# Patient Record
Sex: Male | Born: 1980 | Race: Black or African American | Hispanic: No | Marital: Married | Smoking: Current some day smoker
Health system: Southern US, Community
[De-identification: ages and names within clinical notes are randomized; demographics above are authoritative.]

## PROBLEM LIST (undated history)

## (undated) DIAGNOSIS — G4730 Sleep apnea, unspecified: Secondary | ICD-10-CM

## (undated) HISTORY — PX: VASECTOMY: SHX75

---

## 2013-06-27 ENCOUNTER — Emergency Department: Payer: Self-pay | Admitting: Emergency Medicine

## 2013-07-19 ENCOUNTER — Emergency Department: Payer: Self-pay | Admitting: Emergency Medicine

## 2013-07-28 ENCOUNTER — Emergency Department: Payer: Self-pay | Admitting: Emergency Medicine

## 2013-07-30 LAB — BETA STREP CULTURE(ARMC)

## 2013-08-30 ENCOUNTER — Emergency Department: Payer: Self-pay | Admitting: Emergency Medicine

## 2014-10-16 ENCOUNTER — Emergency Department
Admit: 2014-10-16 | Disposition: A | Discharge status: Home / Self Care | Payer: Self-pay | Admitting: Emergency Medicine

## 2014-10-16 LAB — BASIC METABOLIC PANEL
ANION GAP: 7 (ref 7–16)
BUN: 15 mg/dL
CO2: 30 mmol/L
Calcium, Total: 9.4 mg/dL
Chloride: 102 mmol/L
Creatinine: 1.13 mg/dL
EGFR (African American): >60
EGFR (Non-African Amer.): >60
Glucose: 105 mg/dL — ABNORMAL HIGH
Potassium: 3.7 mmol/L
SODIUM: 139 mmol/L

## 2014-10-16 LAB — URINALYSIS, COMPLETE
Bacteria: NONE SEEN
Bilirubin,UR: NEGATIVE
Blood: NEGATIVE
Glucose,UR: NEGATIVE mg/dL (ref 0–75)
Ketone: NEGATIVE
Leukocyte Esterase: NEGATIVE
NITRITE: NEGATIVE
PROTEIN: NEGATIVE
Ph: 7 (ref 4.5–8.0)
SPECIFIC GRAVITY: 1.015 (ref 1.003–1.030)
Squamous Epithelial: NONE SEEN

## 2014-10-16 LAB — CBC WITH DIFFERENTIAL/PLATELET
BASOS PCT: 0.9 %
Basophil #: 0.1 10*3/uL (ref 0.0–0.1)
EOS ABS: 0 10*3/uL (ref 0.0–0.7)
Eosinophil %: 0.5 %
HCT: 43.7 % (ref 40.0–52.0)
HGB: 14.3 g/dL (ref 13.0–18.0)
LYMPHS PCT: 28.9 %
Lymphocyte #: 2 10*3/uL (ref 1.0–3.6)
MCH: 27.7 pg (ref 26.0–34.0)
MCHC: 32.7 g/dL (ref 32.0–36.0)
MCV: 85 fL (ref 80–100)
Monocyte #: 0.7 x10 3/mm (ref 0.2–1.0)
Monocyte %: 10.3 %
NEUTROS ABS: 4.2 10*3/uL (ref 1.4–6.5)
Neutrophil %: 59.4 %
Platelet: 200 10*3/uL (ref 150–440)
RBC: 5.16 10*6/uL (ref 4.40–5.90)
RDW: 13.7 % (ref 11.5–14.5)
WBC: 7 10*3/uL (ref 3.8–10.6)

## 2014-10-16 LAB — TROPONIN I: Troponin-I: <0.03 ng/mL

## 2014-10-18 LAB — BETA STREP CULTURE(ARMC)

## 2014-10-20 LAB — GC/CHLAMYDIA PROBE AMP

## 2015-02-21 DIAGNOSIS — M545 Low back pain: Secondary | ICD-10-CM | POA: Diagnosis present

## 2015-02-21 DIAGNOSIS — E860 Dehydration: Secondary | ICD-10-CM | POA: Diagnosis not present

## 2015-02-21 DIAGNOSIS — K5900 Constipation, unspecified: Secondary | ICD-10-CM | POA: Insufficient documentation

## 2015-02-21 NOTE — ED Notes (Signed)
Pt in with co low back pain since today, has had urinary frequency.  Also co constipation, last BM Saturday.

## 2015-02-22 ENCOUNTER — Emergency Department
Admission: EM | Admit: 2015-02-22 | Discharge: 2015-02-22 | Disposition: A | Discharge status: Home / Self Care | Payer: 59 | Attending: Emergency Medicine | Admitting: Emergency Medicine

## 2015-02-22 DIAGNOSIS — E860 Dehydration: Secondary | ICD-10-CM

## 2015-02-22 DIAGNOSIS — K5900 Constipation, unspecified: Secondary | ICD-10-CM

## 2015-02-22 LAB — URINALYSIS COMPLETE WITH MICROSCOPIC (ARMC ONLY)
Bacteria, UA: NONE SEEN
Bilirubin Urine: NEGATIVE
Glucose, UA: NEGATIVE mg/dL
HGB URINE DIPSTICK: NEGATIVE
Leukocytes, UA: NEGATIVE
Nitrite: NEGATIVE
Protein, ur: NEGATIVE mg/dL
Specific Gravity, Urine: 1.029 (ref 1.005–1.030)
Squamous Epithelial / LPF: NONE SEEN
pH: 7 (ref 5.0–8.0)

## 2015-02-22 LAB — CBC
HCT: 44.1 % (ref 40.0–52.0)
HEMOGLOBIN: 14.5 g/dL (ref 13.0–18.0)
MCH: 27.8 pg (ref 26.0–34.0)
MCHC: 32.9 g/dL (ref 32.0–36.0)
MCV: 84.5 fL (ref 80.0–100.0)
Platelets: 198 10*3/uL (ref 150–440)
RBC: 5.22 MIL/uL (ref 4.40–5.90)
RDW: 13.4 % (ref 11.5–14.5)
WBC: 9.5 10*3/uL (ref 3.8–10.6)

## 2015-02-22 LAB — BASIC METABOLIC PANEL
ANION GAP: 8 (ref 5–15)
BUN: 17 mg/dL (ref 6–20)
CALCIUM: 9.4 mg/dL (ref 8.9–10.3)
CO2: 29 mmol/L (ref 22–32)
CREATININE: 1.64 mg/dL — AB (ref 0.61–1.24)
Chloride: 97 mmol/L — ABNORMAL LOW (ref 101–111)
GFR calc Af Amer: >60 mL/min (ref >60)
GFR calc non Af Amer: 54 mL/min — ABNORMAL LOW (ref >60)
GLUCOSE: 100 mg/dL — AB (ref 65–99)
Potassium: 4.2 mmol/L (ref 3.5–5.1)
Sodium: 134 mmol/L — ABNORMAL LOW (ref 135–145)

## 2015-02-22 MED ORDER — POLYETHYLENE GLYCOL 3350 17 GM/SCOOP PO POWD: 17.0000 g | Freq: Once | ORAL | Status: AC

## 2015-02-22 NOTE — ED Notes (Signed)
Pt reports vasectomy procedure 3 weeks ago.

## 2015-02-22 NOTE — Discharge Instructions (Signed)
Constipation Constipation is when a person:  Poops (has a bowel movement) less than 3 times a week.  Has a hard time pooping.  Has poop that is dry, hard, or bigger than normal. HOME CARE   Eat foods with a lot of fiber in them. This includes fruits, vegetables, beans, and whole grains such as brown rice.  Avoid fatty foods and foods with a lot of sugar. This includes french fries, hamburgers, cookies, candy, and soda.  If you are not getting enough fiber from food, take products with added fiber in them (supplements).  Drink enough fluid to keep your pee (urine) clear or pale yellow.  Exercise on a regular basis, or as told by your doctor.  Go to the restroom when you feel like you need to poop. Do not hold it.  Only take medicine as told by your doctor. Do not take medicines that help you poop (laxatives) without talking to your doctor first. GET HELP RIGHT AWAY IF:   You have bright red blood in your poop (stool).  Your constipation lasts more than 4 days or gets worse.  You have belly (abdominal) or butt (rectal) pain.  You have thin poop (as thin as a pencil).  You lose weight, and it cannot be explained. MAKE SURE YOU:   Understand these instructions.  Will watch your condition.  Will get help right away if you are not doing well or get worse. Document Released: 12/10/2007 Document Revised: 06/28/2013 Document Reviewed: 04/04/2013 [facility] Patient Information 2015 [place], [place]. This information is not intended to replace advice given to you by your health care provider. Make sure you discuss any questions you have with your health care provider.  Dehydration, Adult Dehydration is when you lose more fluids from the body than you take in. Vital organs like the kidneys, brain, and heart cannot function without a proper amount of fluids and salt. Any loss of fluids from the body can cause dehydration.  CAUSES   Vomiting.  Diarrhea.  Excessive  sweating.  Excessive urine output.  Fever. SYMPTOMS  Mild dehydration  Thirst.  Dry lips.  Slightly dry mouth. Moderate dehydration  Very dry mouth.  Sunken eyes.  Skin does not bounce back quickly when lightly pinched and released.  Dark urine and decreased urine production.  Decreased tear production.  Headache. Severe dehydration  Very dry mouth.  Extreme thirst.  Rapid, weak pulse (more than 100 beats per minute at rest).  Cold hands and feet.  Not able to sweat in spite of heat and temperature.  Rapid breathing.  Blue lips.  Confusion and lethargy.  Difficulty being awakened.  Minimal urine production.  No tears. DIAGNOSIS  Your caregiver will diagnose dehydration based on your symptoms and your exam. Blood and urine tests will help confirm the diagnosis. The diagnostic evaluation should also identify the cause of dehydration. TREATMENT  Treatment of mild or moderate dehydration can often be done at home by increasing the amount of fluids that you drink. It is best to drink small amounts of fluid more often. Drinking too much at one time can make vomiting worse. Refer to the home care instructions below. Severe dehydration needs to be treated at the hospital where you will probably be given intravenous (IV) fluids that contain water and electrolytes. HOME CARE INSTRUCTIONS   Ask your caregiver about specific rehydration instructions.  Drink enough fluids to keep your urine clear or pale yellow.  Drink small amounts frequently if you have nausea and vomiting.  Eat as you normally do.  Avoid:  Foods or drinks high in sugar.  Carbonated drinks.  Juice.  Extremely hot or cold fluids.  Drinks with caffeine.  Fatty, greasy foods.  Alcohol.  Tobacco.  Overeating.  Gelatin desserts.  Wash your hands well to avoid spreading bacteria and viruses.  Only take over-the-counter or prescription medicines for pain, discomfort, or fever as  directed by your caregiver.  Ask your caregiver if you should continue all prescribed and over-the-counter medicines.  Keep all follow-up appointments with your caregiver. SEEK MEDICAL CARE IF:  You have abdominal pain and it increases or stays in one area (localizes).  You have a rash, stiff neck, or severe headache.  You are irritable, sleepy, or difficult to awaken.  You are weak, dizzy, or extremely thirsty. SEEK IMMEDIATE MEDICAL CARE IF:   You are unable to keep fluids down or you get worse despite treatment.  You have frequent episodes of vomiting or diarrhea.  You have blood or green matter (bile) in your vomit.  You have blood in your stool or your stool looks black and tarry.  You have not urinated in 6 to 8 hours, or you have only urinated a small amount of very dark urine.  You have a fever.  You faint. MAKE SURE YOU:   Understand these instructions.  Will watch your condition.  Will get help right away if you are not doing well or get worse. Document Released: 06/23/2005 Document Revised: 09/15/2011 Document Reviewed: 02/10/2011 [facility] Patient Information 2015 [place], [place]. This information is not intended to replace advice given to you by your health care provider. Make sure you discuss any questions you have with your health care provider.

## 2015-02-22 NOTE — ED Provider Notes (Signed)
[facility] Emergency Department Provider Note  Time seen: 2:00 AM  I have reviewed the triage vital signs and the nursing notes.   HISTORY  Chief Complaint Back Pain    HPI Michael Hampton is a 34 y.o. malepresents the emergency department with lower back pain, urinary frequency, and constipation. According to the patient he had a vasectomy 3 weeks ago, since then he is at some trouble with his bowels. He has not had a bowel movement for the past 4 days, however while waiting in the emergency department the patient had a small bowel movement. States darker urine along with urinary frequency but denies any dysuria, penile discharge, groin pain. Denies any abdominal pain, nausea, vomiting, fever. Denies bloody stool.     No past medical history on file.  There are no active problems to display for this patient.   No past surgical history on file.  No current outpatient prescriptions on file.  Allergies Review of patient's allergies indicates no known allergies.  No family history on file.  Social History Social History  Substance Use Topics  . Smoking status: Not on file  . Smokeless tobacco: Not on file  . Alcohol Use: Not on file    Review of Systems Constitutional: Negative for fever. Cardiovascular: Negative for chest pain. Respiratory: Negative for shortness of breath. Gastrointestinal: Negative for abdominal pain. Positive for constipation. Genitourinary: Negative for dysuria. Musculoskeletal: Positive for lower back pain. Neurological: Negative for headache 10-point ROS otherwise negative.  ____________________________________________   PHYSICAL EXAM:  VITAL SIGNS: ED Triage Vitals  Enc Vitals Group     BP 02/21/15 2328 147/97 mmHg     Pulse Rate 02/21/15 2328 106     Resp 02/21/15 2328 18     Temp 02/21/15 2328 98.6 F (37 C)     Temp Source 02/21/15 2328 Oral     SpO2 02/21/15 2328 96 %     Weight 02/21/15 2328 265 lb  (120.203 kg)     Height 02/21/15 2328  (1.676 m)     Head Cir --      Peak Flow --      Pain Score 02/21/15 2328 3     Pain Loc --      Pain Edu? --      Excl. in GC? --     Constitutional: Alert and oriented. Well appearing and in no distress. Eyes: Normal exam ENT   Mouth/Throat: Mucous membranes are moist. Cardiovascular: Normal rate, regular rhythm. No murmur Respiratory: Normal respiratory effort without tachypnea nor retractions. Breath sounds are clear and equal bilaterally. No wheezes/rales/rhonchi. Gastrointestinal: Soft and nontender. No distention.  There is no CVA tenderness. Musculoskeletal: No back tenderness to palpation. Neurologic:  Normal speech and language. No gross focal neurologic deficits Skin:  Skin is warm, dry and intact.  Psychiatric: Mood and affect are normal. Speech and behavior are normal. Patient exhibits appropriate insight and judgment.  ____________________________________________     INITIAL IMPRESSION / ASSESSMENT AND PLAN / ED COURSE  Pertinent labs & imaging results that were available during my care of the patient were reviewed by me and considered in my medical decision making (see chart for details).  We will check labs, urinalysis. The patient admits he has not been drinking much fluid over the past few days. Has not taken anything over-the-counter for his constipation. Overall the patient appears well, nontender abdomen, no CVA tenderness, no back tenderness to palpation.   Overall labs are consistent mild  dehydration. I discussed with the patient the need to increase his oral intake of fluids. Patient agreeable. We'll also place the patient on MiraLAX for the next several days. Patient agreeable as well. Discussed strict abdominal pain return precautions to which she is agreeable. ____________________________________________   FINAL CLINICAL IMPRESSION(S) / ED DIAGNOSES  Constipation Lower back pain   Minna Antis,  MD 02/22/15 [phone]

## 2015-05-30 ENCOUNTER — Emergency Department
Admission: EM | Admit: 2015-05-30 | Discharge: 2015-05-31 | Disposition: A | Discharge status: Home / Self Care | Payer: 59 | Attending: Emergency Medicine | Admitting: Emergency Medicine

## 2015-05-30 ENCOUNTER — Encounter: Payer: Self-pay | Admitting: Emergency Medicine

## 2015-05-30 DIAGNOSIS — N5089 Other specified disorders of the male genital organs: Secondary | ICD-10-CM | POA: Insufficient documentation

## 2015-05-30 DIAGNOSIS — Z9852 Vasectomy status: Secondary | ICD-10-CM | POA: Insufficient documentation

## 2015-05-30 DIAGNOSIS — N50812 Left testicular pain: Secondary | ICD-10-CM | POA: Insufficient documentation

## 2015-05-30 DIAGNOSIS — Z79899 Other long term (current) drug therapy: Secondary | ICD-10-CM | POA: Diagnosis not present

## 2015-05-30 DIAGNOSIS — R358 Other polyuria: Secondary | ICD-10-CM | POA: Diagnosis not present

## 2015-05-30 DIAGNOSIS — R350 Frequency of micturition: Secondary | ICD-10-CM | POA: Diagnosis not present

## 2015-05-30 DIAGNOSIS — N5082 Scrotal pain: Secondary | ICD-10-CM

## 2015-05-30 DIAGNOSIS — R110 Nausea: Secondary | ICD-10-CM | POA: Diagnosis not present

## 2015-05-30 NOTE — ED Notes (Signed)
Patient presents to the ED with left testicular pain and swelling.  Patient states pain started in the wee hours of the morning this morning.  Patient had a vasectomy in September.  Denies any previous swelling and pain after recovery from the surgery.

## 2015-05-30 NOTE — ED Notes (Addendum)
Pt presents to ED with left testicle/groin pain/swelling since yesterday with frequent urination. Pt denies discharge. Pt reports vasectomy done about 2 months ago with no issues.

## 2015-05-30 NOTE — ED Provider Notes (Signed)
[facility] Emergency Department Provider Note  ____________________________________________  Time seen: Approximately 11:21 PM  I have reviewed the triage vital signs and the nursing notes.   HISTORY  Chief Complaint Groin Pain and Groin Swelling    HPI Michael Hampton is a 34 y.o. male who presents to the ED from home with a chief complaint of left testicular pain and swelling. Patient states he awoke yesterday approximately 5 AM, noted pain and swelling to his left testicle associated with frequent urination. Patient denies trauma, heavy lifting or injury. Reports vasectomy done approximately 2 months ago without complications. Patient also notes polydipsia in addition to polyuria. Denies associated fever, chills, chest pain, shortness of breath, abdominal pain, vomiting, diarrhea. States he has occasional nausea with waves of pain. Nothing makes his pain better or worse.   Past medical history None   There are no active problems to display for this patient.   Past Surgical History  Procedure Laterality Date  . Vasectomy      Current Outpatient Rx  Name  Route  Sig  Dispense  Refill  . polyethylene glycol powder (GLYCOLAX/MIRALAX) powder   Oral   Take 17 g by mouth once.   119 g   0     Allergies Review of patient's allergies indicates no known allergies.  History reviewed. No pertinent family history.  Social History Social History  Substance Use Topics  . Smoking status: Current Some Day Smoker    Types: Cigars  . Smokeless tobacco: Never Used  . Alcohol Use: None    Review of Systems Constitutional: No fever/chills Eyes: No visual changes. ENT: No sore throat. Cardiovascular: Denies chest pain. Respiratory: Denies shortness of breath. Gastrointestinal: No abdominal pain.  No nausea, no vomiting.  No diarrhea.  No constipation. Genitourinary: Positive for left testicular pain and swelling. Negative for dysuria. Musculoskeletal:  Negative for back pain. Skin: Negative for rash. Neurological: Negative for headaches, focal weakness or numbness.  10-point ROS otherwise negative.  ____________________________________________   PHYSICAL EXAM:  VITAL SIGNS: ED Triage Vitals  Enc Vitals Group     BP 05/30/15 2244 139/92 mmHg     Pulse Rate 05/30/15 2244 80     Resp 05/30/15 2244 16     Temp 05/30/15 2244 98.3 F (36.8 C)     Temp Source 05/30/15 2244 Oral     SpO2 05/30/15 2244 94 %     Weight --      Height --      Head Cir --      Peak Flow --      Pain Score 05/30/15 2245 2     Pain Loc --      Pain Edu? --      Excl. in GC? --     Constitutional: Alert and oriented. Well appearing and in no acute distress. Eyes: Conjunctivae are normal. PERRL. EOMI. Head: Atraumatic. Nose: No congestion/rhinnorhea. Mouth/Throat: Mucous membranes are moist.  Oropharynx non-erythematous. Neck: No stridor.   Cardiovascular: Normal rate, regular rhythm. Grossly normal heart sounds.  Good peripheral circulation. Respiratory: Normal respiratory effort.  No retractions. Lungs CTAB. Gastrointestinal: Soft and nontender. No distention. No abdominal bruits. No CVA tenderness. Genitourinary: Circumcised male. No palpable inguinal masses or hernias. No discharge from the head of the penis. Left testicle does not appear to be particularly swollen. Mild tenderness on palpation to posterior left testicle. No firm or horizontally lying masses noted. Musculoskeletal: No lower extremity tenderness nor edema.  No joint  effusions. Neurologic:  Normal speech and language. No gross focal neurologic deficits are appreciated. No gait instability. Skin:  Skin is warm, dry and intact. No rash noted. Psychiatric: Mood and affect are normal. Speech and behavior are normal.  ____________________________________________   LABS (all labs ordered are listed, but only abnormal results are displayed)  Labs Reviewed  URINALYSIS COMPLETEWITH  MICROSCOPIC ([facility] ONLY) - Abnormal; Notable for the following:    Color, Urine YELLOW (*)    APPearance CLEAR (*)    All other components within normal limits  BASIC METABOLIC PANEL - Abnormal; Notable for the following:    Glucose, Bld 132 (*)    Anion gap 4 (*)    All other components within normal limits  CBC WITH DIFFERENTIAL/PLATELET   ____________________________________________  EKG  None ____________________________________________  RADIOLOGY  Ultrasound scrotum interpreted per Dr. Cherly Hensen: Unremarkable scrotal ultrasound. No evidence for testicular torsion. ____________________________________________   PROCEDURES  Procedure(s) performed: None  Critical Care performed: No  ____________________________________________   INITIAL IMPRESSION / ASSESSMENT AND PLAN / ED COURSE  Pertinent labs & imaging results that were available during my care of the patient were reviewed by me and considered in my medical decision making (see chart for details).  34 year old male who presents with nontraumatic left testicular pain and swelling times one day associated with frequent urination. Given patient's complaints of polydipsia as well and family history of diabetes, will obtain screening lab work in addition to urinalysis and testicular ultrasound.  ----------------------------------------- 1:25 AM on 05/31/2015 -----------------------------------------  Updated patient of laboratory, urinalysis and imaging results. Will place on empiric Cipro 5 days for clinical presentation of epididymitis. Strict return precautions given. Patient verbalizes understanding and agrees with plan of care. ____________________________________________   FINAL CLINICAL IMPRESSION(S) / ED DIAGNOSES  Final diagnoses:  Testicular pain, left      Irean Hong, MD 05/31/15 [phone]

## 2015-05-31 ENCOUNTER — Emergency Department: Payer: 59

## 2015-05-31 LAB — CBC WITH DIFFERENTIAL/PLATELET
BASOS PCT: 1 %
Basophils Absolute: 0.1 10*3/uL (ref 0–0.1)
EOS ABS: 0.1 10*3/uL (ref 0–0.7)
EOS PCT: 1 %
HEMATOCRIT: 43.6 % (ref 40.0–52.0)
Hemoglobin: 14.4 g/dL (ref 13.0–18.0)
Lymphocytes Relative: 34 %
Lymphs Abs: 2.8 10*3/uL (ref 1.0–3.6)
MCH: 27.9 pg (ref 26.0–34.0)
MCHC: 32.9 g/dL (ref 32.0–36.0)
MCV: 84.7 fL (ref 80.0–100.0)
MONOS PCT: 9 %
Monocytes Absolute: 0.7 10*3/uL (ref 0.2–1.0)
Neutro Abs: 4.5 10*3/uL (ref 1.4–6.5)
Neutrophils Relative %: 55 %
Platelets: 175 10*3/uL (ref 150–440)
RBC: 5.14 MIL/uL (ref 4.40–5.90)
RDW: 13.7 % (ref 11.5–14.5)
WBC: 8.2 10*3/uL (ref 3.8–10.6)

## 2015-05-31 LAB — URINALYSIS COMPLETE WITH MICROSCOPIC (ARMC ONLY)
Bacteria, UA: NONE SEEN
Bilirubin Urine: NEGATIVE
Glucose, UA: NEGATIVE mg/dL
HGB URINE DIPSTICK: NEGATIVE
KETONES UR: NEGATIVE mg/dL
LEUKOCYTES UA: NEGATIVE
Nitrite: NEGATIVE
PH: 6 (ref 5.0–8.0)
Protein, ur: NEGATIVE mg/dL
RBC / HPF: NONE SEEN RBC/hpf (ref 0–5)
SPECIFIC GRAVITY, URINE: 1.016 (ref 1.005–1.030)
Squamous Epithelial / LPF: NONE SEEN
WBC, UA: NONE SEEN WBC/hpf (ref 0–5)

## 2015-05-31 LAB — BASIC METABOLIC PANEL
Anion gap: 4 — ABNORMAL LOW (ref 5–15)
BUN: 16 mg/dL (ref 6–20)
CO2: 27 mmol/L (ref 22–32)
Calcium: 9.3 mg/dL (ref 8.9–10.3)
Chloride: 106 mmol/L (ref 101–111)
Creatinine, Ser: 1.09 mg/dL (ref 0.61–1.24)
GFR calc Af Amer: >60 mL/min (ref >60)
GFR calc non Af Amer: >60 mL/min (ref >60)
Glucose, Bld: 132 mg/dL — ABNORMAL HIGH (ref 65–99)
Potassium: 3.7 mmol/L (ref 3.5–5.1)
SODIUM: 137 mmol/L (ref 135–145)

## 2015-05-31 MED ORDER — CIPROFLOXACIN HCL 500 MG PO TABS
500.0000 mg | ORAL_TABLET | Freq: Once | ORAL | Status: AC
  Administered 2015-05-31: 500 mg via ORAL
  Filled 2015-05-31: qty 1

## 2015-05-31 MED ORDER — CIPROFLOXACIN HCL 500 MG PO TABS: 500.0000 mg | ORAL_TABLET | Freq: Two times a day (BID) | ORAL | Status: DC

## 2015-05-31 NOTE — Discharge Instructions (Signed)
1. Take antibiotic as prescribed (Cipro 500 mg twice daily 5 days). 2. Return to the ER for worsening symptoms, persistent vomiting, difficulty breathing or other concerns.

## 2015-12-18 ENCOUNTER — Ambulatory Visit: Payer: BLUE CROSS/BLUE SHIELD | Attending: Internal Medicine

## 2015-12-18 DIAGNOSIS — G4710 Hypersomnia, unspecified: Secondary | ICD-10-CM | POA: Diagnosis present

## 2015-12-18 DIAGNOSIS — G4733 Obstructive sleep apnea (adult) (pediatric): Secondary | ICD-10-CM | POA: Insufficient documentation

## 2015-12-18 DIAGNOSIS — E669 Obesity, unspecified: Secondary | ICD-10-CM | POA: Insufficient documentation

## 2016-02-24 ENCOUNTER — Emergency Department: Payer: BLUE CROSS/BLUE SHIELD

## 2016-02-24 ENCOUNTER — Emergency Department
Admission: EM | Admit: 2016-02-24 | Discharge: 2016-02-24 | Disposition: A | Discharge status: Home / Self Care | Payer: BLUE CROSS/BLUE SHIELD | Attending: Emergency Medicine | Admitting: Emergency Medicine

## 2016-02-24 ENCOUNTER — Encounter: Payer: Self-pay | Admitting: Emergency Medicine

## 2016-02-24 DIAGNOSIS — R5383 Other fatigue: Secondary | ICD-10-CM | POA: Diagnosis present

## 2016-02-24 DIAGNOSIS — F17210 Nicotine dependence, cigarettes, uncomplicated: Secondary | ICD-10-CM | POA: Diagnosis not present

## 2016-02-24 HISTORY — DX: Sleep apnea, unspecified: G47.30

## 2016-02-24 LAB — COMPREHENSIVE METABOLIC PANEL
ALBUMIN: 4.4 g/dL (ref 3.5–5.0)
ALT: 68 U/L — ABNORMAL HIGH (ref 17–63)
ANION GAP: 10 (ref 5–15)
AST: 35 U/L (ref 15–41)
Alkaline Phosphatase: 66 U/L (ref 38–126)
BUN: 15 mg/dL (ref 6–20)
CHLORIDE: 104 mmol/L (ref 101–111)
CO2: 22 mmol/L (ref 22–32)
Calcium: 9.5 mg/dL (ref 8.9–10.3)
Creatinine, Ser: 1.1 mg/dL (ref 0.61–1.24)
GFR calc Af Amer: >60 mL/min (ref >60)
GFR calc non Af Amer: >60 mL/min (ref >60)
GLUCOSE: 100 mg/dL — AB (ref 65–99)
POTASSIUM: 4.3 mmol/L (ref 3.5–5.1)
SODIUM: 136 mmol/L (ref 135–145)
Total Bilirubin: 1.1 mg/dL (ref 0.3–1.2)
Total Protein: 8.4 g/dL — ABNORMAL HIGH (ref 6.5–8.1)

## 2016-02-24 LAB — CBC WITH DIFFERENTIAL/PLATELET
Basophils Absolute: 0.1 10*3/uL (ref 0–0.1)
Basophils Relative: 1 %
EOS PCT: 0 %
Eosinophils Absolute: 0 10*3/uL (ref 0–0.7)
HCT: 46 % (ref 40.0–52.0)
Hemoglobin: 16.1 g/dL (ref 13.0–18.0)
LYMPHS ABS: 1.8 10*3/uL (ref 1.0–3.6)
LYMPHS PCT: 27 %
MCH: 28.8 pg (ref 26.0–34.0)
MCHC: 35 g/dL (ref 32.0–36.0)
MCV: 82.3 fL (ref 80.0–100.0)
MONO ABS: 0.7 10*3/uL (ref 0.2–1.0)
MONOS PCT: 10 %
NEUTROS ABS: 4 10*3/uL (ref 1.4–6.5)
Neutrophils Relative %: 62 %
PLATELETS: 204 10*3/uL (ref 150–440)
RBC: 5.59 MIL/uL (ref 4.40–5.90)
RDW: 13.7 % (ref 11.5–14.5)
WBC: 6.5 10*3/uL (ref 3.8–10.6)

## 2016-02-24 LAB — URINALYSIS COMPLETE WITH MICROSCOPIC (ARMC ONLY)
BACTERIA UA: NONE SEEN
BILIRUBIN URINE: NEGATIVE
Glucose, UA: NEGATIVE mg/dL
HGB URINE DIPSTICK: NEGATIVE
Ketones, ur: NEGATIVE mg/dL
Leukocytes, UA: NEGATIVE
Nitrite: NEGATIVE
Protein, ur: NEGATIVE mg/dL
Specific Gravity, Urine: 1.016 (ref 1.005–1.030)
Squamous Epithelial / LPF: NONE SEEN
pH: 6 (ref 5.0–8.0)

## 2016-02-24 LAB — GLUCOSE, CAPILLARY: Glucose-Capillary: 98 mg/dL (ref 65–99)

## 2016-02-24 MED ORDER — SODIUM CHLORIDE 0.9 % IV BOLUS (SEPSIS)
1000.0000 mL | Freq: Once | INTRAVENOUS | Status: AC
  Administered 2016-02-24: 1000 mL via INTRAVENOUS

## 2016-02-24 NOTE — ED Notes (Signed)
Pt states "no one (provider) came in to discuss my results and why I feel this way" at discharge. This nurse explained d/c paperwork ; pt declined offer for provider to explain d/c paperwork and results

## 2016-02-24 NOTE — ED Provider Notes (Signed)
[facility] Emergency Department Provider Note  ____________________________________________  Time seen: Approximately 1:03 PM  I have reviewed the triage vital signs and the nursing notes.   HISTORY  Chief Complaint Fatigue    HPI Michael Hampton is a 35 y.o. male presents for evaluation ofa weight with a dry throat and feeling dehydrated. Patient states that he feels fatigued all the time. Recently had a sleep study CPAP machine has been ordered. Patient is reported to have sleep apnea and is scheduled for blood work, however patient states he doesn't feel like he can wait till tomorrow to get his lab work. Rundown no energy and weak and fatigued are his symptoms.   Past Medical History:  Diagnosis Date  . Sleep apnea     There are no active problems to display for this patient.   Past Surgical History:  Procedure Laterality Date  . VASECTOMY      Prior to Admission medications   Medication Sig Start Date End Date Taking? Authorizing Provider  ciprofloxacin (CIPRO) 500 MG tablet Take 1 tablet (500 mg total) by mouth 2 (two) times daily. 05/31/15   Irean HongJade J Sung, MD  polyethylene glycol powder (GLYCOLAX/MIRALAX) powder Take 17 g by mouth once. 02/22/15   Minna AntisKevin Paduchowski, MD    Allergies Review of patient's allergies indicates no known allergies.  History reviewed. No pertinent family history.  Social History Social History  Substance Use Topics  . Smoking status: Current Some Day Smoker    Types: Cigars  . Smokeless tobacco: Never Used  . Alcohol use No    Review of Systems Constitutional: Positive for weakness and fatigue. Eyes: No visual changes. ENT: No sore throat. Cardiovascular: Denies chest pain. Respiratory: Denies shortness of breath. Gastrointestinal: No abdominal pain.  No nausea, no vomiting.  No diarrhea.  No constipation. Genitourinary: Negative for dysuria. Musculoskeletal: Negative for back pain. Skin: Negative for  rash. Neurological: Negative for headaches, focal weakness or numbness.  10-point ROS otherwise negative.  ____________________________________________   PHYSICAL EXAM:  VITAL SIGNS: ED Triage Vitals  Enc Vitals Group     BP 02/24/16 1246 120/72     Pulse Rate 02/24/16 1246 83     Resp --      Temp 02/24/16 1246 98.5 F (36.9 C)     Temp Source 02/24/16 1246 Oral     SpO2 02/24/16 1246 97 %     Weight 02/24/16 1247 265 lb (120.2 kg)     Height 02/24/16 1247 5\' 6"  (1.676 m)     Head Circumference --      Peak Flow --      Pain Score --      Pain Loc --      Pain Edu? --      Excl. in GC? --     Constitutional: Alert and oriented. Well appearing and in no acute distress. Eyes: Conjunctivae are normal. PERRL. EOMI. Head: Atraumatic. Nose: No congestion/rhinnorhea. Mouth/Throat: Mucous membranes are moist.  Oropharynx non-erythematous. Neck: No stridor.   Cardiovascular: Normal rate, regular rhythm. Grossly normal heart sounds.  Good peripheral circulation. Respiratory: Normal respiratory effort.  No retractions. Lungs CTAB. Gastrointestinal: Soft and nontender. No distention. No CVA tenderness. Musculoskeletal: No lower extremity tenderness nor edema.  No joint effusions. Neurologic:  Normal speech and language. No gross focal neurologic deficits are appreciated. No gait instability. Skin:  Skin is warm, dry and intact. No rash noted. Psychiatric: Mood and affect are normal. Speech and behavior are normal.  Patient just feels rundown no energy.  ____________________________________________   LABS (all labs ordered are listed, but only abnormal results are displayed)  Labs Reviewed  COMPREHENSIVE METABOLIC PANEL - Abnormal; Notable for the following:       Result Value   Glucose, Bld 100 (*)    Total Protein 8.4 (*)    ALT 68 (*)    All other components within normal limits  URINALYSIS COMPLETEWITH MICROSCOPIC ([facility] ONLY) - Abnormal; Notable for the following:     Color, Urine YELLOW (*)    APPearance CLEAR (*)    All other components within normal limits  GLUCOSE, CAPILLARY  CBC WITH DIFFERENTIAL/PLATELET  CBG MONITORING, ED   ____________________________________________  EKG  No evidence of STEMI. ____________________________________________  RADIOLOGY   ____________________________________________   PROCEDURES  Procedure(s) performed: None  Critical Care performed: No  ____________________________________________   INITIAL IMPRESSION / ASSESSMENT AND PLAN / ED COURSE  Pertinent labs & imaging results that were available during my care of the patient were reviewed by me and considered in my medical decision making (see chart for details). Review of the [place] CSRS was performed in accordance of the [facility] prior to dispensing any controlled drugs.  Chronic fatigue. Patient has history of sleep apnea encourage follow-up with his PCP and started on his CPAP machine. Patient voices no other emergency medical complaints at this time.  Clinical Course    ____________________________________________   FINAL CLINICAL IMPRESSION(S) / ED DIAGNOSES  Final diagnoses:  Other fatigue     This chart was dictated using voice recognition software/Dragon. Despite best efforts to proofread, errors can occur which can change the meaning. Any change was purely unintentional.    Evangeline Dakinharles M Hazell Siwik, PA-C 02/24/16 1536    Governor Rooksebecca Lord, MD 02/24/16 1537

## 2016-02-24 NOTE — ED Notes (Signed)
Removed IV LEFT AC  LM EDT

## 2016-02-24 NOTE — ED Triage Notes (Signed)
Weakness x 1 week, awaking with dry throat and feeling dehydrated. States he had a sleep study and a cpap machine has been ordered. [facility]. Started a diet 1 1/2 weeks ago.

## 2016-02-24 NOTE — ED Notes (Signed)
Pt states he has had weakness for the past couple of days with weakness and fatigue. Pt states he has tried to stay "hydrated" by drinking water and gatorade. Pt recently seen by PCP for sleep study; pt said he is to use new cpap soon; gets up frequently in the night

## 2016-05-15 ENCOUNTER — Encounter: Payer: Self-pay | Admitting: *Deleted

## 2016-05-15 ENCOUNTER — Emergency Department
Admission: EM | Admit: 2016-05-15 | Discharge: 2016-05-16 | Disposition: A | Discharge status: Home / Self Care | Payer: BLUE CROSS/BLUE SHIELD | Attending: Emergency Medicine | Admitting: Emergency Medicine

## 2016-05-15 DIAGNOSIS — F17290 Nicotine dependence, other tobacco product, uncomplicated: Secondary | ICD-10-CM | POA: Diagnosis not present

## 2016-05-15 DIAGNOSIS — R079 Chest pain, unspecified: Secondary | ICD-10-CM | POA: Diagnosis present

## 2016-05-15 DIAGNOSIS — Z792 Long term (current) use of antibiotics: Secondary | ICD-10-CM | POA: Insufficient documentation

## 2016-05-15 DIAGNOSIS — R0789 Other chest pain: Secondary | ICD-10-CM

## 2016-05-15 DIAGNOSIS — R51 Headache: Secondary | ICD-10-CM | POA: Diagnosis not present

## 2016-05-15 LAB — BASIC METABOLIC PANEL
ANION GAP: 7 (ref 5–15)
BUN: 18 mg/dL (ref 6–20)
CALCIUM: 9.3 mg/dL (ref 8.9–10.3)
CHLORIDE: 103 mmol/L (ref 101–111)
CO2: 26 mmol/L (ref 22–32)
Creatinine, Ser: 1.18 mg/dL (ref 0.61–1.24)
GFR calc non Af Amer: >60 mL/min (ref >60)
GLUCOSE: 115 mg/dL — AB (ref 65–99)
Potassium: 4 mmol/L (ref 3.5–5.1)
Sodium: 136 mmol/L (ref 135–145)

## 2016-05-15 LAB — CBC
HEMATOCRIT: 43.2 % (ref 40.0–52.0)
HEMOGLOBIN: 14.9 g/dL (ref 13.0–18.0)
MCH: 28.5 pg (ref 26.0–34.0)
MCHC: 34.5 g/dL (ref 32.0–36.0)
MCV: 82.7 fL (ref 80.0–100.0)
Platelets: 201 10*3/uL (ref 150–440)
RBC: 5.22 MIL/uL (ref 4.40–5.90)
RDW: 13.8 % (ref 11.5–14.5)
WBC: 8.3 10*3/uL (ref 3.8–10.6)

## 2016-05-15 LAB — TROPONIN I

## 2016-05-15 MED ORDER — ALUMINUM-MAGNESIUM-SIMETHICONE 200-200-20 MG/5ML PO SUSP: 30.0000 mL | Freq: Three times a day (TID) | ORAL | 0 refills | Status: AC

## 2016-05-15 MED ORDER — METOCLOPRAMIDE HCL 10 MG PO TABS: 10.0000 mg | ORAL_TABLET | Freq: Four times a day (QID) | ORAL | 0 refills | Status: AC | PRN

## 2016-05-15 NOTE — ED Notes (Signed)
Pt states CP on L side, no radiation but states L arm and neck weakness. Symptoms began around 1 week ago. Pt denies SOB, states some nausea but no vomiting. Pt states it does not feel muscular. States some indigestion, but eating does not make pain worse.

## 2016-05-15 NOTE — ED Triage Notes (Signed)
Pt has chest pain for 2 days.  Intermittent pain in left chest.  Non radiating pain. No sob. No n/v/d   Non smoker.  Pt alert.

## 2016-05-15 NOTE — ED Provider Notes (Signed)
[facility] Emergency Department Provider Note  ____________________________________________  Time seen: Approximately 11:45 PM  I have reviewed the triage vital signs and the nursing notes.   HISTORY  Chief Complaint Chest Pain    HPI Michael Hampton is a 35 y.o. male who complains of intermittent unusual sensation in the left anterior chest. He denies pain shortness of breath nausea vomiting diaphoresis or specific radiation. He is not really able to describe it further. It lasts for a few seconds at a time. He tried taking Prilosec but that hasn't seemed to resolve it. He also reports intermittent fleeting migratory myalgias, bilateral frontal headache.  Very concerned about his sleeping. He's been diagnosed with sleep apnea from a sleep study but as yet to receive the CPAP machine that was recommended. He follows up with Dr. Marcello FennelHande of primary care     Past Medical History:  Diagnosis Date  . Sleep apnea      There are no active problems to display for this patient.    Past Surgical History:  Procedure Laterality Date  . VASECTOMY       Prior to Admission medications   Medication Sig Start Date End Date Taking? Authorizing Provider  aluminum-magnesium hydroxide-simethicone (MAALOX) 200-200-20 MG/5ML SUSP Take 30 mLs by mouth 4 (four) times daily -  before meals and at bedtime. 05/15/16   Sharman CheekPhillip Miyako Oelke, MD  ciprofloxacin (CIPRO) 500 MG tablet Take 1 tablet (500 mg total) by mouth 2 (two) times daily. 05/31/15   Irean HongJade J Sung, MD  metoCLOPramide (REGLAN) 10 MG tablet Take 1 tablet (10 mg total) by mouth every 6 (six) hours as needed. 05/15/16   Sharman CheekPhillip Elfrida Pixley, MD  polyethylene glycol powder (GLYCOLAX/MIRALAX) powder Take 17 g by mouth once. 02/22/15   Minna AntisKevin Paduchowski, MD     Allergies Patient has no known allergies.   No family history on file.  Social History Social History  Substance Use Topics  . Smoking status: Current Some Day  Smoker    Types: Cigars  . Smokeless tobacco: Never Used  . Alcohol use No    Review of Systems  Constitutional:   No fever or chills.  Cardiovascular:   No chest pain.Positive chest discomfort as above Respiratory:   No dyspnea or cough. Gastrointestinal:   Negative for abdominal pain, vomiting and diarrhea.   10-point ROS otherwise negative.  ____________________________________________   PHYSICAL EXAM:  VITAL SIGNS: ED Triage Vitals  Enc Vitals Group     BP 05/15/16 2157 124/69     Pulse Rate 05/15/16 2157 73     Resp 05/15/16 2157 20     Temp 05/15/16 2157 98.6 F (37 C)     Temp Source 05/15/16 2157 Oral     SpO2 05/15/16 2157 100 %     Weight 05/15/16 2158 270 lb (122.5 kg)     Height 05/15/16 2158 5\' 6"  (1.676 m)     Head Circumference --      Peak Flow --      Pain Score 05/15/16 2158 2     Pain Loc --      Pain Edu? --      Excl. in GC? --     Vital signs reviewed, nursing assessments reviewed.   Constitutional:   Alert and oriented. Well appearing and in no distress. Eyes:   No scleral icterus. No conjunctival pallor. PERRL. EOMI.  No nystagmus. ENT   Head:   Normocephalic and atraumatic.   Nose:   No  congestion/rhinnorhea. No septal hematoma   Mouth/Throat:   MMM, no pharyngeal erythema. No peritonsillar mass.    Neck:   No stridor. No SubQ emphysema. No meningismus. Hematological/Lymphatic/Immunilogical:   No cervical lymphadenopathy. Cardiovascular:   RRR. Symmetric bilateral radial and DP pulses.  No murmurs.  Respiratory:   Normal respiratory effort without tachypnea nor retractions. Breath sounds are clear and equal bilaterally. No wheezes/rales/rhonchi.Chest wall nontender Gastrointestinal:   Soft and nontender. Non distended. There is no CVA tenderness.  No rebound, rigidity, or guarding. Genitourinary:   deferred Musculoskeletal:   Nontender with normal range of motion in all extremities. No joint effusions.  No lower extremity  tenderness.  No edema. Neurologic:   Normal speech and language.  CN 2-10 normal. Motor grossly intact. No gross focal neurologic deficits are appreciated.  Skin:    Skin is warm, dry and intact. No rash noted.  No petechiae, purpura, or bullae.  ____________________________________________    LABS (pertinent positives/negatives) (all labs ordered are listed, but only abnormal results are displayed) Labs Reviewed  BASIC METABOLIC PANEL - Abnormal; Notable for the following:       Result Value   Glucose, Bld 115 (*)    All other components within normal limits  CBC  TROPONIN I   ____________________________________________   EKG  Interpreted by me  Date: 05/15/2016  Rate: 66  Rhythm: normal sinus rhythm  QRS Axis: normal  Intervals: normal  ST/T Wave abnormalities: normal  Conduction Disutrbances: none  Narrative Interpretation: unremarkable      ____________________________________________    RADIOLOGY    ____________________________________________   PROCEDURES Procedures  ____________________________________________   INITIAL IMPRESSION / ASSESSMENT AND PLAN / ED COURSE  Pertinent labs & imaging results that were available during my care of the patient were reviewed by me and considered in my medical decision making (see chart for details).  Patient well appearing no acute distress, presents with multiple vague symptoms that do not appear to fit any particular pathologic syndrome.Considering the patient's symptoms, medical history, and physical examination today, I have low suspicion for ACS, PE, TAD, pneumothorax, carditis, mediastinitis, pneumonia, CHF, or sepsis.       Clinical Course    ____________________________________________   FINAL CLINICAL IMPRESSION(S) / ED DIAGNOSES  Final diagnoses:  Atypical chest pain       Portions of this note were generated with dragon dictation software. Dictation errors may occur despite best  attempts at proofreading.    Sharman CheekPhillip Meagan Spease, MD 05/15/16 [phone]

## 2017-03-20 ENCOUNTER — Encounter: Payer: Self-pay | Admitting: Emergency Medicine

## 2017-03-20 ENCOUNTER — Emergency Department
Admission: EM | Admit: 2017-03-20 | Discharge: 2017-03-20 | Disposition: A | Discharge status: Home / Self Care | Payer: BLUE CROSS/BLUE SHIELD | Attending: Emergency Medicine | Admitting: Emergency Medicine

## 2017-03-20 ENCOUNTER — Emergency Department: Payer: BLUE CROSS/BLUE SHIELD

## 2017-03-20 DIAGNOSIS — F17290 Nicotine dependence, other tobacco product, uncomplicated: Secondary | ICD-10-CM | POA: Insufficient documentation

## 2017-03-20 DIAGNOSIS — R5383 Other fatigue: Secondary | ICD-10-CM | POA: Diagnosis not present

## 2017-03-20 DIAGNOSIS — Z79899 Other long term (current) drug therapy: Secondary | ICD-10-CM | POA: Insufficient documentation

## 2017-03-20 DIAGNOSIS — R5381 Other malaise: Secondary | ICD-10-CM

## 2017-03-20 LAB — CBC
HCT: 44 % (ref 40.0–52.0)
Hemoglobin: 15 g/dL (ref 13.0–18.0)
MCH: 28.5 pg (ref 26.0–34.0)
MCHC: 34 g/dL (ref 32.0–36.0)
MCV: 83.8 fL (ref 80.0–100.0)
PLATELETS: 178 10*3/uL (ref 150–440)
RBC: 5.26 MIL/uL (ref 4.40–5.90)
RDW: 13.6 % (ref 11.5–14.5)
WBC: 7.2 10*3/uL (ref 3.8–10.6)

## 2017-03-20 LAB — URINALYSIS, COMPLETE (UACMP) WITH MICROSCOPIC
BACTERIA UA: NONE SEEN
BILIRUBIN URINE: NEGATIVE
Glucose, UA: NEGATIVE mg/dL
HGB URINE DIPSTICK: NEGATIVE
KETONES UR: NEGATIVE mg/dL
LEUKOCYTES UA: NEGATIVE
NITRITE: NEGATIVE
PH: 6 (ref 5.0–8.0)
Protein, ur: NEGATIVE mg/dL
RBC / HPF: NONE SEEN RBC/hpf (ref 0–5)
SPECIFIC GRAVITY, URINE: 1.008 (ref 1.005–1.030)
Squamous Epithelial / LPF: NONE SEEN

## 2017-03-20 LAB — BASIC METABOLIC PANEL
Anion gap: 8 (ref 5–15)
BUN: 12 mg/dL (ref 6–20)
CHLORIDE: 101 mmol/L (ref 101–111)
CO2: 26 mmol/L (ref 22–32)
CREATININE: 1.28 mg/dL — AB (ref 0.61–1.24)
Calcium: 9 mg/dL (ref 8.9–10.3)
GFR calc Af Amer: >60 mL/min (ref >60)
GFR calc non Af Amer: >60 mL/min (ref >60)
Glucose, Bld: 104 mg/dL — ABNORMAL HIGH (ref 65–99)
Potassium: 3.9 mmol/L (ref 3.5–5.1)
SODIUM: 135 mmol/L (ref 135–145)

## 2017-03-20 LAB — FIBRIN DERIVATIVES D-DIMER (ARMC ONLY): Fibrin derivatives D-dimer (ARMC): 334.39 (ref 0.00–499.00)

## 2017-03-20 LAB — BRAIN NATRIURETIC PEPTIDE: B NATRIURETIC PEPTIDE 5: 14 pg/mL (ref 0.0–100.0)

## 2017-03-20 LAB — TROPONIN I: Troponin I: <0.03 ng/mL (ref <0.03)

## 2017-03-20 MED ORDER — DIPHENHYDRAMINE HCL 50 MG/ML IJ SOLN
25.0000 mg | Freq: Once | INTRAMUSCULAR | Status: AC
  Administered 2017-03-20: 25 mg via INTRAVENOUS
  Filled 2017-03-20: qty 1

## 2017-03-20 MED ORDER — SODIUM CHLORIDE 0.9 % IV BOLUS (SEPSIS): 1000.0000 mL | Freq: Once | INTRAVENOUS | Status: DC

## 2017-03-20 MED ORDER — PROCHLORPERAZINE EDISYLATE 5 MG/ML IJ SOLN
10.0000 mg | Freq: Once | INTRAMUSCULAR | Status: AC
  Administered 2017-03-20: 10 mg via INTRAVENOUS
  Filled 2017-03-20: qty 2

## 2017-03-20 MED ORDER — SODIUM CHLORIDE 0.9 % IV BOLUS (SEPSIS)
1000.0000 mL | Freq: Once | INTRAVENOUS | Status: AC
  Administered 2017-03-20: 1000 mL via INTRAVENOUS

## 2017-03-20 NOTE — Discharge Instructions (Signed)
Fortunately today your blood work was reassuring and you did not have a heart attack. Please make an appointment to follow-up with your primary care provider next week for reevaluation. Return to the emergency department for any concerns.  It was a pleasure to take care of you today, and thank you for coming to our emergency department.  If you have any questions or concerns before leaving please ask the nurse to grab me and I'm more than happy to go through your aftercare instructions again.  If you were prescribed any opioid pain medication today such as Norco, Vicodin, Percocet, morphine, hydrocodone, or oxycodone please make sure you do not drive when you are taking this medication as it can alter your ability to drive safely.  If you have any concerns once you are home that you are not improving or are in fact getting worse before you can make it to your follow-up appointment, please do not hesitate to call 911 and come back for further evaluation.  Merrily Brittle, MD  Results for orders placed or performed during the hospital encounter of 03/20/17  Basic metabolic panel  Result Value Ref Range   Sodium 135 135 - 145 mmol/L   Potassium 3.9 3.5 - 5.1 mmol/L   Chloride 101 101 - 111 mmol/L   CO2 26 22 - 32 mmol/L   Glucose, Bld 104 (H) 65 - 99 mg/dL   BUN 12 6 - 20 mg/dL   Creatinine, Ser 1.61 (H) 0.61 - 1.24 mg/dL   Calcium 9.0 8.9 - 09.6 mg/dL   GFR calc non Af Amer >60 >60 mL/min   GFR calc Af Amer >60 >60 mL/min   Anion gap 8 5 - 15  CBC  Result Value Ref Range   WBC 7.2 3.8 - 10.6 K/uL   RBC 5.26 4.40 - 5.90 MIL/uL   Hemoglobin 15.0 13.0 - 18.0 g/dL   HCT 04.5 40.9 - 81.1 %   MCV 83.8 80.0 - 100.0 fL   MCH 28.5 26.0 - 34.0 pg   MCHC 34.0 32.0 - 36.0 g/dL   RDW 91.4 78.2 - 95.6 %   Platelets 178 150 - 440 K/uL  Urinalysis, Complete w Microscopic  Result Value Ref Range   Color, Urine STRAW (A) YELLOW   APPearance CLEAR (A) CLEAR   Specific Gravity, Urine 1.008 1.005 -  1.030   pH 6.0 5.0 - 8.0   Glucose, UA NEGATIVE NEGATIVE mg/dL   Hgb urine dipstick NEGATIVE NEGATIVE   Bilirubin Urine NEGATIVE NEGATIVE   Ketones, ur NEGATIVE NEGATIVE mg/dL   Protein, ur NEGATIVE NEGATIVE mg/dL   Nitrite NEGATIVE NEGATIVE   Leukocytes, UA NEGATIVE NEGATIVE   RBC / HPF NONE SEEN 0 - 5 RBC/hpf   WBC, UA 0-5 0 - 5 WBC/hpf   Bacteria, UA NONE SEEN NONE SEEN   Squamous Epithelial / LPF NONE SEEN NONE SEEN  Brain natriuretic peptide  Result Value Ref Range   B Natriuretic Peptide 14.0 0.0 - 100.0 pg/mL  Troponin I  Result Value Ref Range   Troponin I <0.03 <0.03 ng/mL  Fibrin derivatives D-Dimer  Result Value Ref Range   Fibrin derivatives D-dimer ([facility]) 334.39 0.00 - 499.00   Dg Chest 2 View  Result Date: 03/20/2017 CLINICAL DATA:  Fatigue. Nausea, dizziness, and weakness beginning 2 days ago. Abnormal EKG. EXAM: CHEST  2 VIEW COMPARISON:  02/24/2016 FINDINGS: The cardiomediastinal silhouette is within normal limits. The lungs are mildly hypoinflated. No confluent airspace opacity, edema, pleural effusion, or  pneumothorax is identified. No acute osseous abnormality is seen. IMPRESSION: No active cardiopulmonary disease. Electronically Signed   By: Sebastian Ache M.D.   On: 03/20/2017 17:41

## 2017-03-20 NOTE — ED Notes (Signed)
Helped pt to bathroom. 

## 2017-03-20 NOTE — ED Triage Notes (Signed)
Patient presents to ED via POV from home with c/o generalized weakness. Patient states, "I just feel faint". Patient denies pain. Denies N/V/D. Patient denies syncope.

## 2017-03-20 NOTE — ED Provider Notes (Signed)
[facility] Emergency Department Provider Note  ____________________________________________   First MD Initiated Contact with Patient 03/20/17 1530     (approximate)  I have reviewed the triage vital signs and the nursing notes.   HISTORY  Chief Complaint Fatigue    HPI Michael Hampton is a 36 y.o. male who presents to the emergency department with multiple complaints. He reports generalized malaise and fatigue for the past several days. He denies chest pain or shortness of breath. He denies abdominal pain although he does report some numbness no weakness. He denies cough. He denies fevers or chills. His symptoms are insidious in onset and slowly progressive. Nothing seems to make it better or worse.He does also reports a gradual onset not maximal onset bifrontal throbbing headache similar to previous headaches.   Past Medical History:  Diagnosis Date  . Sleep apnea     There are no active problems to display for this patient.   Past Surgical History:  Procedure Laterality Date  . VASECTOMY      Prior to Admission medications   Medication Sig Start Date End Date Taking? Authorizing Provider  Multiple Vitamins-Minerals (MULTIVITAMIN ADULT PO) Take 1 tablet by mouth daily.   Yes [provider]  aluminum-magnesium hydroxide-simethicone (MAALOX) 200-200-20 MG/5ML SUSP Take 30 mLs by mouth 4 (four) times daily -  before meals and at bedtime. Patient not taking: Reported on 03/20/2017 05/15/16   Sharman Cheek, MD  metoCLOPramide (REGLAN) 10 MG tablet Take 1 tablet (10 mg total) by mouth every 6 (six) hours as needed. Patient not taking: Reported on 03/20/2017 05/15/16   Sharman Cheek, MD  polyethylene glycol powder [facility]) powder Take 17 g by mouth once. Patient not taking: Reported on 03/20/2017 02/22/15   Minna Antis, MD    Allergies Patient has no known allergies.  No family history on file.  Social  History Social History  Substance Use Topics  . Smoking status: Current Some Day Smoker    Types: Cigars  . Smokeless tobacco: Never Used  . Alcohol use No    Review of Systems Constitutional: No fever/chills Eyes: No visual changes. ENT: No sore throat. Cardiovascular: Denies chest pain. Respiratory: Denies shortness of breath. Gastrointestinal: No abdominal pain.  Positive nausea, no vomiting.  No diarrhea.  No constipation. Genitourinary: Negative for dysuria. Musculoskeletal: Negative for back pain. Skin: Negative for rash. Neurological: Positive for headaches   ____________________________________________   PHYSICAL EXAM:  VITAL SIGNS: ED Triage Vitals  Enc Vitals Group     BP 03/20/17 1434 (!) 152/85     Pulse Rate 03/20/17 1434 93     Resp 03/20/17 1434 17     Temp 03/20/17 1434 99.5 F (37.5 C)     Temp Source 03/20/17 1434 Oral     SpO2 03/20/17 1434 99 %     Weight 03/20/17 1434 285 lb (129.3 kg)     Height 03/20/17 1434  (1.676 m)     Head Circumference --      Peak Flow --      Pain Score 03/20/17 1528 3     Pain Loc --      Pain Edu? --      Excl. in GC? --     Constitutional: Alert and oriented 4 well appearing nontoxic no diaphoresis speaks full clear sentences Eyes: PERRL EOMI. Head: Atraumatic. Nose: No congestion/rhinnorhea. Mouth/Throat: No trismus Neck: No stridor.   Cardiovascular: Normal rate, regular rhythm. Grossly normal heart sounds.  Good  peripheral circulation. Respiratory: Normal respiratory effort.  No retractions. Lungs CTAB and moving good air Gastrointestinal: Soft nontender Musculoskeletal: No lower extremity edema   Neurologic:  Normal speech and language. No gross focal neurologic deficits are appreciated. Skin:  Skin is warm, dry and intact. No rash noted. Psychiatric: Mood and affect are normal. Speech and behavior are normal.    ____________________________________________   DIFFERENTIAL includes but not  limited to  Metabolic drainage, infection, acute coronary syndrome, tension headache ____________________________________________   LABS (all labs ordered are listed, but only abnormal results are displayed)  Labs Reviewed  BASIC METABOLIC PANEL - Abnormal; Notable for the following:       Result Value   Glucose, Bld 104 (*)    Creatinine, Ser 1.28 (*)    All other components within normal limits  URINALYSIS, COMPLETE (UACMP) WITH MICROSCOPIC - Abnormal; Notable for the following:    Color, Urine STRAW (*)    APPearance CLEAR (*)    All other components within normal limits  CBC  BRAIN NATRIURETIC PEPTIDE  TROPONIN I  FIBRIN DERIVATIVES D-DIMER ([facility] ONLY)    Blood work unremarkable __________________________________________  EKG  ----------------------------------------- ED ECG REPORT I, Merrily Brittle, the attending physician, personally viewed and interpreted this ECG.  Date: 03/20/2017 EKG Time:  Rate: 86 Rhythm: normal sinus rhythm QRS Axis: normal Intervals: normal ST/T Wave abnormalities: T-wave inversions in leads 3 aVF and V6 no reciprocal ST elevation Narrative Interpretation: Abnormal EKG different from previous EKG in November 2017 concerning for ischemia  Repeat EKG unchanged ________________________________________  RADIOLOGY   ____________________________________________   PROCEDURES  Procedure(s) performed: no  Procedures  Critical Care performed: no  Observation: no ____________________________________________   INITIAL IMPRESSION / ASSESSMENT AND PLAN / ED COURSE  Pertinent labs & imaging results that were available during my care of the patient were reviewed by me and considered in my medical decision making (see chart for details).  The patient arrives hemodynamically stable well appearing. His symptoms are vague and do not suggest cardiac etiology although he does have some changes on his EKG which are not dynamic. Labs are  pending.  Fortunately the patient's blood work is unremarkable and his EKG is not dynamic. He feels improved and would like to go home. Encouraged him to follow up with primary care physician and is discharged home in improved condition.      ____________________________________________   FINAL CLINICAL IMPRESSION(S) / ED DIAGNOSES  Final diagnoses:  Fatigue, unspecified type  Malaise      NEW MEDICATIONS STARTED DURING THIS VISIT:  Discharge Medication List as of 03/20/2017  9:46 PM       Note:  This document was prepared using Dragon voice recognition software and may include unintentional dictation errors.     Merrily Brittle, MD 03/20/17 [phone]

## 2017-03-20 NOTE — ED Notes (Signed)
Pt states feeling nauseous, faint, and dizzy. Also has a HA. Started 2 days ago.

## 2018-01-21 ENCOUNTER — Encounter: Payer: Self-pay | Admitting: Emergency Medicine

## 2018-01-21 ENCOUNTER — Emergency Department
Admission: EM | Admit: 2018-01-21 | Discharge: 2018-01-21 | Disposition: A | Discharge status: Home / Self Care | Payer: BLUE CROSS/BLUE SHIELD | Attending: Emergency Medicine | Admitting: Emergency Medicine

## 2018-01-21 DIAGNOSIS — F17290 Nicotine dependence, other tobacco product, uncomplicated: Secondary | ICD-10-CM | POA: Diagnosis not present

## 2018-01-21 DIAGNOSIS — Z79899 Other long term (current) drug therapy: Secondary | ICD-10-CM | POA: Diagnosis not present

## 2018-01-21 DIAGNOSIS — Z20818 Contact with and (suspected) exposure to other bacterial communicable diseases: Secondary | ICD-10-CM | POA: Insufficient documentation

## 2018-01-21 DIAGNOSIS — J029 Acute pharyngitis, unspecified: Secondary | ICD-10-CM | POA: Insufficient documentation

## 2018-01-21 LAB — GROUP A STREP BY PCR: GROUP A STREP BY PCR: NOT DETECTED

## 2018-01-21 MED ORDER — PENICILLIN G BENZATHINE 1200000 UNIT/2ML IM SUSP
INTRAMUSCULAR | Status: AC
  Filled 2018-01-21: qty 2

## 2018-01-21 MED ORDER — PENICILLIN G BENZATHINE & PROC 1200000 UNIT/2ML IM SUSP
1.2000 10*6.[IU] | Freq: Once | INTRAMUSCULAR | Status: AC
  Administered 2018-01-21: 1.2 10*6.[IU] via INTRAMUSCULAR

## 2018-01-21 NOTE — ED Provider Notes (Signed)
[facility] Emergency Department Provider Note   ____________________________________________   First MD Initiated Contact with Patient 01/21/18 0818     (approximate)  I have reviewed the triage vital signs and the nursing notes.   HISTORY  Chief Complaint Sore Throat    HPI Michael Hampton is a 37 y.o. male patient complain of sore throat for several days.  Patient states wife is diagnosed and treated with strep recently.  Patient denies fever or other URI signs and symptoms.  Patient rates his pain discomfort a 6/10.  Patient describes his discomfort as "sore".  Patient able to tolerate food and fluids.  Past Medical History:  Diagnosis Date  . Sleep apnea     There are no active problems to display for this patient.   Past Surgical History:  Procedure Laterality Date  . VASECTOMY      Prior to Admission medications   Medication Sig Start Date End Date Taking? Authorizing Provider  aluminum-magnesium hydroxide-simethicone (MAALOX) 200-200-20 MG/5ML SUSP Take 30 mLs by mouth 4 (four) times daily -  before meals and at bedtime. Patient not taking: Reported on 03/20/2017 05/15/16   Sharman Cheek, MD  metoCLOPramide (REGLAN) 10 MG tablet Take 1 tablet (10 mg total) by mouth every 6 (six) hours as needed. Patient not taking: Reported on 03/20/2017 05/15/16   Sharman Cheek, MD  Multiple Vitamins-Minerals (MULTIVITAMIN ADULT PO) Take 1 tablet by mouth daily.    [provider]  polyethylene glycol powder (GLYCOLAX/MIRALAX) powder Take 17 g by mouth once. Patient not taking: Reported on 03/20/2017 02/22/15   Minna Antis, MD    Allergies Patient has no known allergies.  No family history on file.  Social History Social History   Tobacco Use  . Smoking status: Current Some Day Smoker    Types: Cigars  . Smokeless tobacco: Never Used  Substance Use Topics  . Alcohol use: No  . Drug use: No    Review of  Systems Constitutional: No fever/chills Eyes: No visual changes. ENT: Sore throat.   Cardiovascular: Denies chest pain. Respiratory: Denies shortness of breath. Gastrointestinal: No abdominal pain.  No nausea, no vomiting.  No diarrhea.  No constipation. Genitourinary: Negative for dysuria. Musculoskeletal: Negative for back pain. Skin: Negative for rash. Neurological: Negative for headaches, focal weakness or numbness.   ____________________________________________   PHYSICAL EXAM:  VITAL SIGNS: ED Triage Vitals  Enc Vitals Group     BP 01/21/18 0818 119/65     Pulse Rate 01/21/18 0818 66     Resp 01/21/18 0818 16     Temp 01/21/18 0818 98.2 F (36.8 C)     Temp Source 01/21/18 0818 Oral     SpO2 01/21/18 0818 95 %     Weight 01/21/18 0805 285 lb (129.3 kg)     Height 01/21/18 0805 5\' 6"  (1.676 m)     Head Circumference --      Peak Flow --      Pain Score 01/21/18 0805 6     Pain Loc --      Pain Edu? --      Excl. in GC? --     Constitutional: Alert and oriented. Well appearing and in no acute distress. Nose: No congestion/rhinnorhea. Mouth/Throat: Mucous membranes are moist.  Oropharynx erythematous.  Not exudative tonsils. Neck: No stridor. Hematological/Lymphatic/Immunilogical: Bilateral cervical lymphadenopathy. Cardiovascular: Normal rate, regular rhythm. Grossly normal heart sounds.  Good peripheral circulation. Respiratory: Normal respiratory effort.  No retractions. Lungs CTAB. Neurologic:  Normal speech and language. No gross focal neurologic deficits are appreciated. No gait instability. Skin:  Skin is warm, dry and intact. No rash noted. Psychiatric: Mood and affect are normal. Speech and behavior are normal.  ____________________________________________   LABS (all labs ordered are listed, but only abnormal results are displayed)  Labs Reviewed  GROUP A STREP BY PCR    ____________________________________________  EKG   ____________________________________________  RADIOLOGY  ED MD interpretation:    Official radiology report(s): No results found.  ____________________________________________   PROCEDURES  Procedure(s) performed: None  Procedures  Critical Care performed: No  ____________________________________________   INITIAL IMPRESSION / ASSESSMENT AND PLAN / ED COURSE  As part of my medical decision making, I reviewed the following data within the electronic MEDICAL RECORD NUMBER    Sore throat secondary to strep exposure.  Discussed patient strep test was negative.  Patient will be treated prophylactically secondary to positive strep exposure from wife.  Follow-up PCP.      ____________________________________________   FINAL CLINICAL IMPRESSION(S) / ED DIAGNOSES  Final diagnoses:  Strep throat exposure     ED Discharge Orders    None       Note:  This document was prepared using Dragon voice recognition software and may include unintentional dictation errors.    Joni ReiningSmith, Ronald K, PA-C 01/21/18 [id number]    Sharyn CreamerQuale, Mark, MD 01/30/18 [phone]

## 2018-01-21 NOTE — ED Notes (Signed)
See triage note  Presents with sore throat which started this am   No fever  But having increased pain with swallowing

## 2018-01-21 NOTE — Discharge Instructions (Signed)
Your strep test was negative.  You are being treated prophylactically due to your positive strep exposure.

## 2018-01-21 NOTE — ED Triage Notes (Signed)
Pt reports wife was dx'd with strep throat a few days ago and now he thinks she has it. Pt c/o sore throat. Denies fevers.

## 2018-09-01 IMAGING — CR DG CHEST 2V
1 series · 2 of 2 positions shown · non-contrast
Comparison: 02/24/2016

CLINICAL DATA: Fatigue. Nausea, dizziness, and weakness beginning 2
days ago. Abnormal EKG.

EXAM:
CHEST  2 VIEW

[Series 1: dg chest 2 view · 0.14mm/px · 2 of 2 slices shown]
[im 1/2]
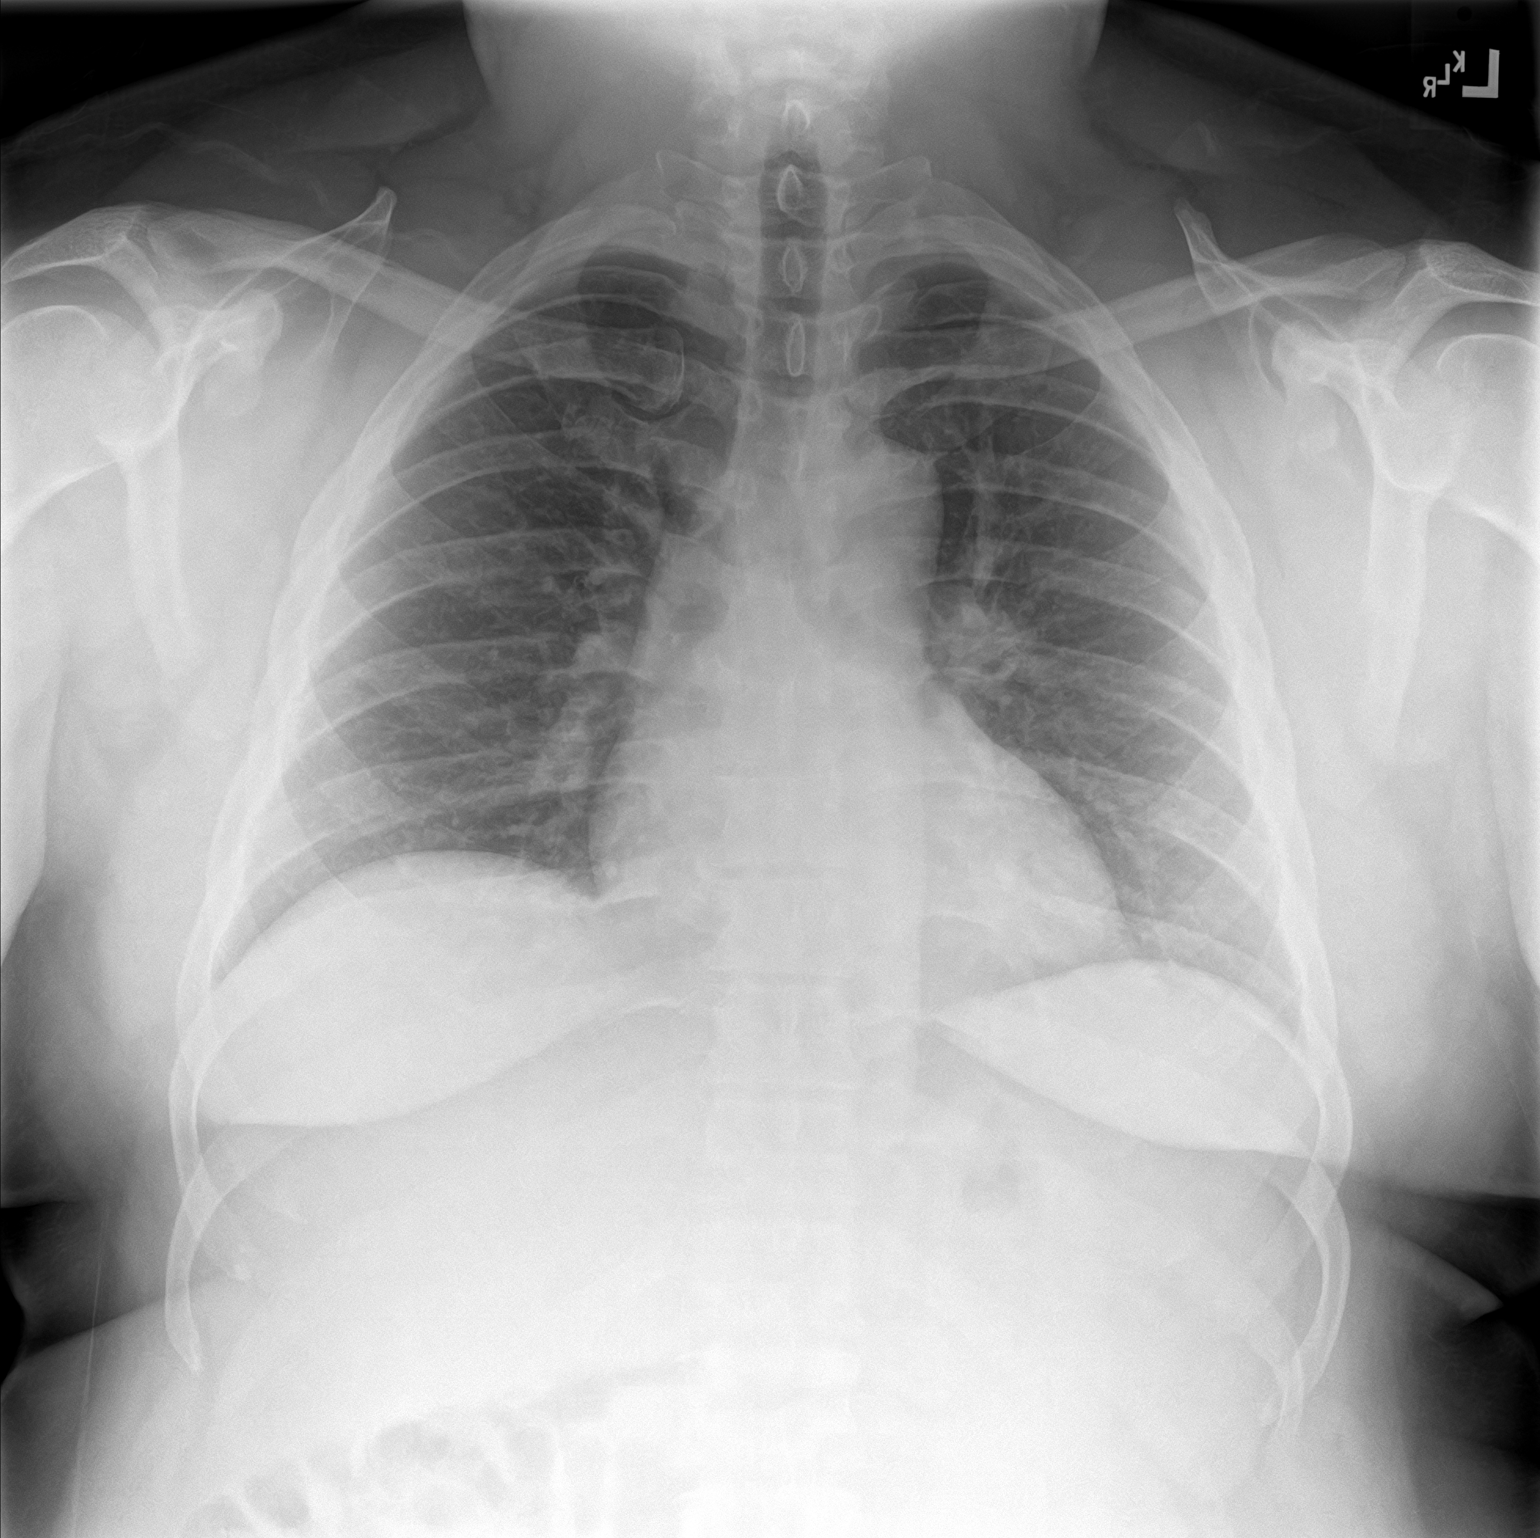
[im 2/2]
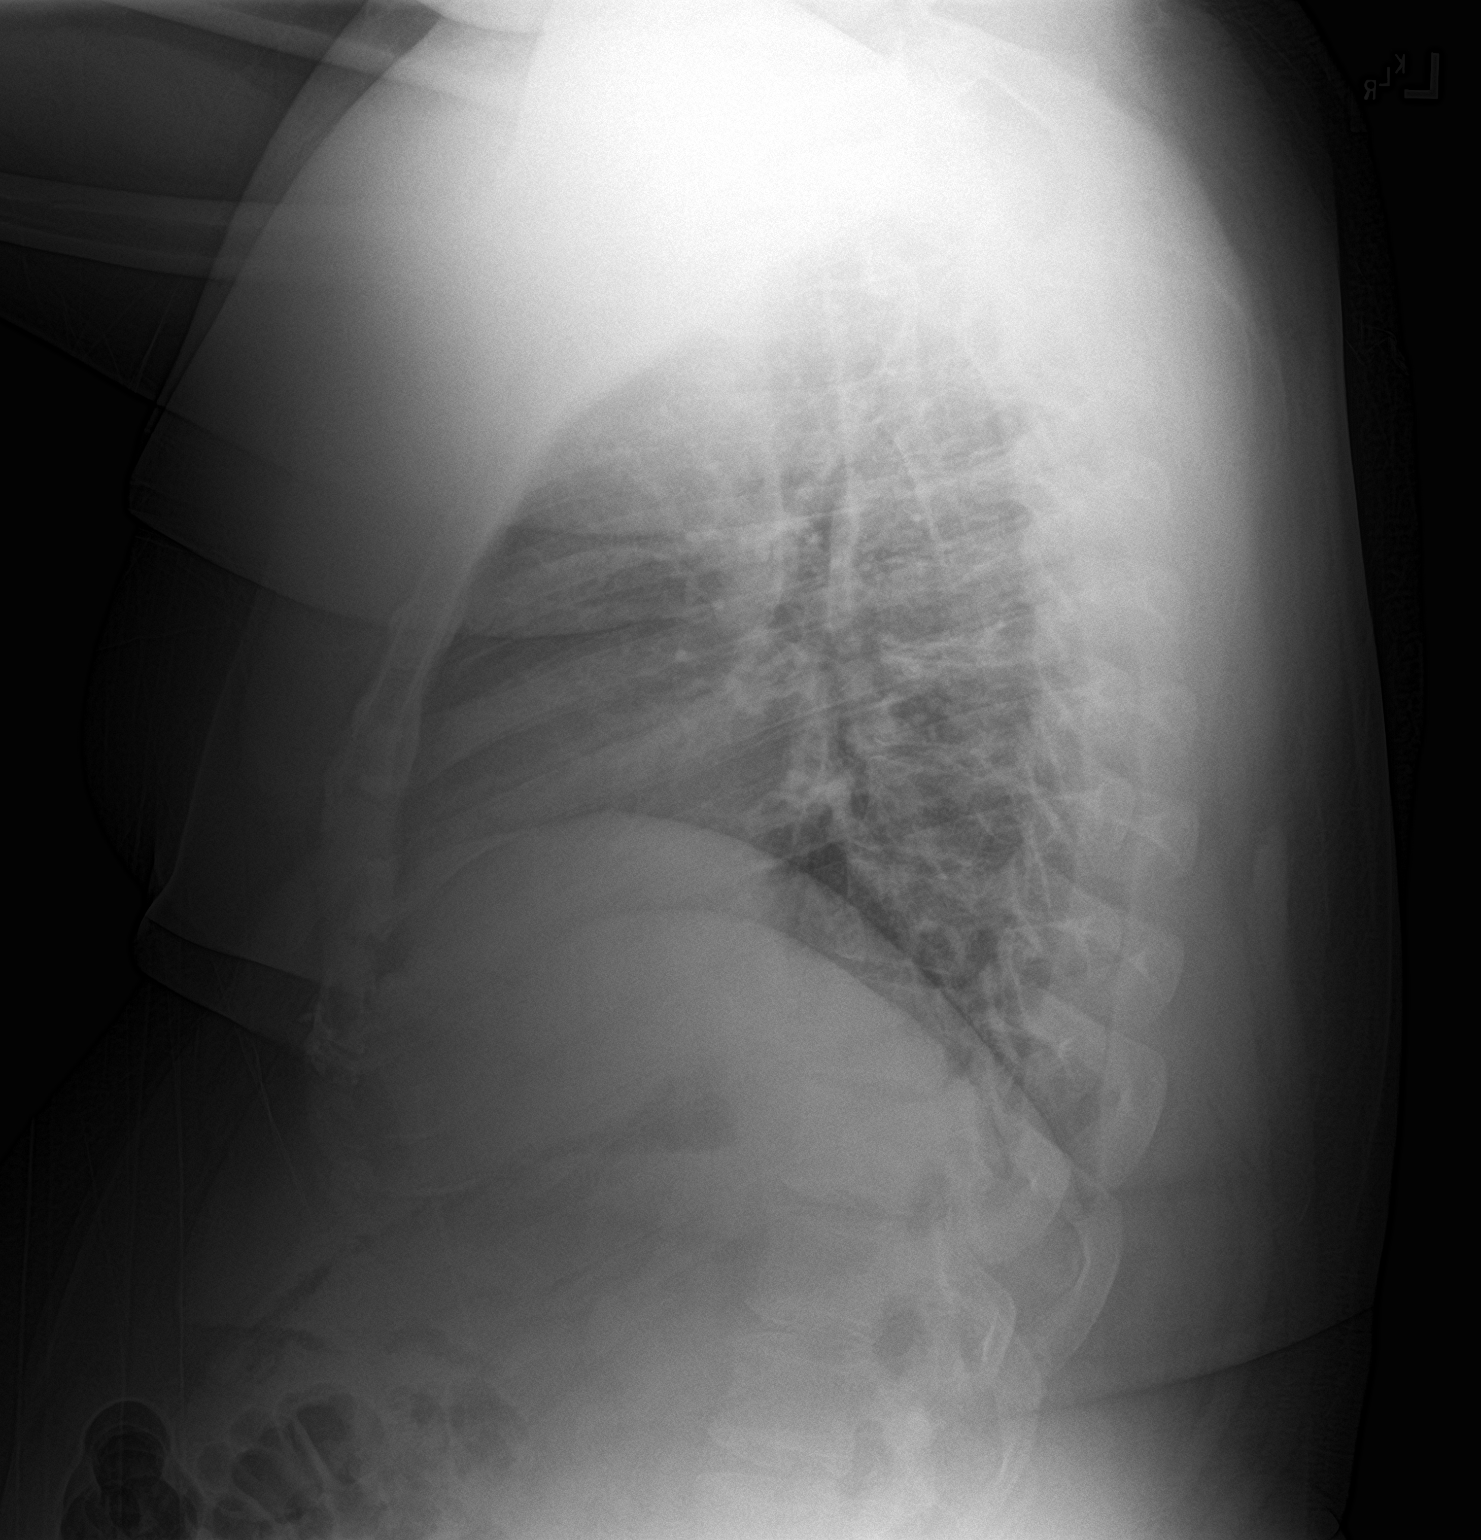

[2 of 2 positions shown; findings below may reference images not displayed]

FINDINGS: The cardiomediastinal silhouette is within normal limits. The lungs
are mildly hypoinflated. No confluent airspace opacity, edema,
pleural effusion, or pneumothorax is identified. No acute osseous
abnormality is seen.
IMPRESSION: No active cardiopulmonary disease.

## 2019-02-28 ENCOUNTER — Other Ambulatory Visit: Payer: Self-pay

## 2019-02-28 ENCOUNTER — Emergency Department: Payer: Managed Care, Other (non HMO)

## 2019-02-28 ENCOUNTER — Encounter: Payer: Self-pay | Admitting: Emergency Medicine

## 2019-02-28 ENCOUNTER — Emergency Department
Admission: EM | Admit: 2019-02-28 | Discharge: 2019-02-28 | Disposition: A | Discharge status: Home / Self Care | Payer: Managed Care, Other (non HMO) | Attending: Emergency Medicine | Admitting: Emergency Medicine

## 2019-02-28 DIAGNOSIS — R079 Chest pain, unspecified: Secondary | ICD-10-CM | POA: Diagnosis present

## 2019-02-28 DIAGNOSIS — Z79899 Other long term (current) drug therapy: Secondary | ICD-10-CM | POA: Diagnosis not present

## 2019-02-28 DIAGNOSIS — Z20822 Contact with and (suspected) exposure to covid-19: Secondary | ICD-10-CM

## 2019-02-28 DIAGNOSIS — F17290 Nicotine dependence, other tobacco product, uncomplicated: Secondary | ICD-10-CM | POA: Diagnosis not present

## 2019-02-28 DIAGNOSIS — Z20828 Contact with and (suspected) exposure to other viral communicable diseases: Secondary | ICD-10-CM | POA: Diagnosis not present

## 2019-02-28 DIAGNOSIS — K219 Gastro-esophageal reflux disease without esophagitis: Secondary | ICD-10-CM

## 2019-02-28 LAB — BASIC METABOLIC PANEL
Anion gap: 11 (ref 5–15)
BUN: 15 mg/dL (ref 6–20)
CO2: 25 mmol/L (ref 22–32)
Calcium: 9 mg/dL (ref 8.9–10.3)
Chloride: 101 mmol/L (ref 98–111)
Creatinine, Ser: 1.14 mg/dL (ref 0.61–1.24)
GFR calc Af Amer: >60 mL/min (ref >60)
GFR calc non Af Amer: >60 mL/min (ref >60)
Glucose, Bld: 137 mg/dL — ABNORMAL HIGH (ref 70–99)
Potassium: 3.5 mmol/L (ref 3.5–5.1)
Sodium: 137 mmol/L (ref 135–145)

## 2019-02-28 LAB — CBC
HCT: 43 % (ref 39.0–52.0)
Hemoglobin: 14.3 g/dL (ref 13.0–17.0)
MCH: 28.4 pg (ref 26.0–34.0)
MCHC: 33.3 g/dL (ref 30.0–36.0)
MCV: 85.3 fL (ref 80.0–100.0)
Platelets: 199 10*3/uL (ref 150–400)
RBC: 5.04 MIL/uL (ref 4.22–5.81)
RDW: 12.9 % (ref 11.5–15.5)
WBC: 8.3 10*3/uL (ref 4.0–10.5)
nRBC: 0 % (ref 0.0–0.2)

## 2019-02-28 LAB — TROPONIN I (HIGH SENSITIVITY)
Troponin I (High Sensitivity): 4 ng/L (ref <18)
Troponin I (High Sensitivity): 4 ng/L (ref <18)

## 2019-02-28 LAB — HEPATIC FUNCTION PANEL
ALT: 56 U/L — ABNORMAL HIGH (ref 0–44)
AST: 31 U/L (ref 15–41)
Albumin: 3.8 g/dL (ref 3.5–5.0)
Alkaline Phosphatase: 60 U/L (ref 38–126)
Bilirubin, Direct: <0.1 mg/dL (ref 0.0–0.2)
Total Bilirubin: 0.6 mg/dL (ref 0.3–1.2)
Total Protein: 7.2 g/dL (ref 6.5–8.1)

## 2019-02-28 LAB — SARS CORONAVIRUS 2 (TAT 6-24 HRS): SARS Coronavirus 2: NEGATIVE

## 2019-02-28 LAB — LIPASE, BLOOD: Lipase: 20 U/L (ref 11–51)

## 2019-02-28 MED ORDER — FAMOTIDINE 40 MG PO TABS: 40.0000 mg | ORAL_TABLET | Freq: Every day | ORAL | 0 refills | Status: AC

## 2019-02-28 MED ORDER — ONDANSETRON 4 MG PO TBDP: 4.0000 mg | ORAL_TABLET | Freq: Three times a day (TID) | ORAL | 0 refills | Status: AC | PRN

## 2019-02-28 MED ORDER — LIDOCAINE VISCOUS HCL 2 % MT SOLN
15.0000 mL | Freq: Once | OROMUCOSAL | Status: AC
  Administered 2019-02-28: 03:00:00 15 mL via ORAL
  Filled 2019-02-28: qty 15

## 2019-02-28 MED ORDER — ALUM & MAG HYDROXIDE-SIMETH 200-200-20 MG/5ML PO SUSP
30.0000 mL | Freq: Once | ORAL | Status: AC
  Administered 2019-02-28: 03:00:00 30 mL via ORAL
  Filled 2019-02-28: qty 30

## 2019-02-28 MED ORDER — FAMOTIDINE IN NACL 20-0.9 MG/50ML-% IV SOLN
20.0000 mg | Freq: Once | INTRAVENOUS | Status: AC
  Administered 2019-02-28: 03:00:00 20 mg via INTRAVENOUS
  Filled 2019-02-28: qty 50

## 2019-02-28 MED ORDER — SODIUM CHLORIDE 0.9 % IV BOLUS
1000.0000 mL | Freq: Once | INTRAVENOUS | Status: AC
  Administered 2019-02-28: 03:00:00 1000 mL via INTRAVENOUS

## 2019-02-28 MED ORDER — ONDANSETRON HCL 4 MG/2ML IJ SOLN
4.0000 mg | Freq: Once | INTRAMUSCULAR | Status: AC
  Administered 2019-02-28: 03:00:00 4 mg via INTRAVENOUS
  Filled 2019-02-28: qty 2

## 2019-02-28 NOTE — Discharge Instructions (Signed)

## 2019-02-28 NOTE — ED Provider Notes (Signed)
[facility] Emergency Department Provider Note  ____________________________________________  Time seen: Approximately 3:42 AM  I have reviewed the triage vital signs and the nursing notes.   HISTORY  Chief Complaint Chest Pain, Fatigue, Headache, and Cough   HPI Michael Hampton is a 38 y.o. male with a history of OSA not on CPAP who presents for evaluation of COVID-like symptoms.  Symptoms started today.  He is complaining of generalized body aches, intermittent headache, dry cough, fatigue.  Patient is complaining of GERD which he describes as big air bubbles in his chest causing pressure.  He has been belching a lot.  Reports that the pressure in his chest resolves with belching.  Also reports shortness of breath with the pressure that resolves after belching.  He also reports diarrhea but no nausea or vomiting, no abdominal pain.  Patient reports exposure to a cold with positive patient 2 days ago.   Past Medical History:  Diagnosis Date  . Sleep apnea     Past Surgical History:  Procedure Laterality Date  . VASECTOMY      Prior to Admission medications   Medication Sig Start Date End Date Taking? Authorizing Provider  aluminum-magnesium hydroxide-simethicone (MAALOX) 025-852-77 MG/5ML SUSP Take 30 mLs by mouth 4 (four) times daily -  before meals and at bedtime. Patient not taking: Reported on 03/20/2017 05/15/16   Carrie Mew, MD  famotidine (PEPCID) 40 MG tablet Take 1 tablet (40 mg total) by mouth at bedtime. 02/28/19 03/30/19  Rudene Re, MD  metoCLOPramide (REGLAN) 10 MG tablet Take 1 tablet (10 mg total) by mouth every 6 (six) hours as needed. Patient not taking: Reported on 03/20/2017 05/15/16   Carrie Mew, MD  Multiple Vitamins-Minerals (MULTIVITAMIN ADULT PO) Take 1 tablet by mouth daily.    [provider]  ondansetron (ZOFRAN ODT) 4 MG disintegrating tablet Take 1 tablet (4 mg total) by mouth every 8 (eight) hours as  needed. 02/28/19   Alfred Levins, [place], MD  polyethylene glycol powder (GLYCOLAX/MIRALAX) powder Take 17 g by mouth once. Patient not taking: Reported on 03/20/2017 02/22/15   Harvest Dark, MD    Allergies Patient has no known allergies.  History reviewed. No pertinent family history.  Social History Social History   Tobacco Use  . Smoking status: Current Some Day Smoker    Types: Cigars  . Smokeless tobacco: Never Used  Substance Use Topics  . Alcohol use: No  . Drug use: No    Review of Systems  Constitutional: Negative for fever. + Body aches, fatigue Eyes: Negative for visual changes. ENT: Negative for sore throat. Neck: No neck pain  Cardiovascular: + chest pressue Respiratory: + shortness of breath. Gastrointestinal: Negative for abdominal pain, vomiting. + diarrhea. Genitourinary: Negative for dysuria. Musculoskeletal: Negative for back pain. Skin: Negative for rash. Neurological: Negative for headaches, weakness or numbness. Psych: No SI or HI  ____________________________________________   PHYSICAL EXAM:  VITAL SIGNS: ED Triage Vitals  Enc Vitals Group     BP 02/28/19 0102 137/79     Pulse Rate 02/28/19 0102 78     Resp 02/28/19 0102 17     Temp 02/28/19 0102 99.2 F (37.3 C)     Temp Source 02/28/19 0102 Oral     SpO2 02/28/19 0102 98 %     Weight 02/28/19 0108 285 lb (129.3 kg)     Height 02/28/19 0108 5\' 5"  (1.651 m)     Head Circumference --      Peak  Flow --      Pain Score 02/28/19 0107 2     Pain Loc --      Pain Edu? --      Excl. in GC? --     Constitutional: Alert and oriented. Well appearing and in no apparent distress. HEENT:      Head: Normocephalic and atraumatic.         Eyes: Conjunctivae are normal. Sclera is non-icteric.       Mouth/Throat: Mucous membranes are moist.       Neck: Supple with no signs of meningismus. Cardiovascular: Regular rate and rhythm. No murmurs, gallops, or rubs. 2+ symmetrical distal pulses are  present in all extremities. No JVD. Respiratory: Normal respiratory effort. Lungs are clear to auscultation bilaterally. No wheezes, crackles, or rhonchi.  Gastrointestinal: Soft, non tender, and non distended with positive bowel sounds. No rebound or guarding. Musculoskeletal: Nontender with normal range of motion in all extremities. No edema, cyanosis, or erythema of extremities. Neurologic: Normal speech and language. Face is symmetric. Moving all extremities. No gross focal neurologic deficits are appreciated. Skin: Skin is warm, dry and intact. No rash noted. Psychiatric: Mood and affect are normal. Speech and behavior are normal.  ____________________________________________   LABS (all labs ordered are listed, but only abnormal results are displayed)  Labs Reviewed  BASIC METABOLIC PANEL - Abnormal; Notable for the following components:      Result Value   Glucose, Bld 137 (*)    All other components within normal limits  HEPATIC FUNCTION PANEL - Abnormal; Notable for the following components:   ALT 56 (*)    All other components within normal limits  SARS CORONAVIRUS 2  CBC  LIPASE, BLOOD  TROPONIN I (HIGH SENSITIVITY)  TROPONIN I (HIGH SENSITIVITY)   ____________________________________________  EKG  ED ECG REPORT I, Nita Sicklearolina Hjalmar Ballengee, the attending physician, personally viewed and interpreted this ECG.  Normal sinus rhythm, rate of 84, normal intervals, normal axis, no ST elevations or depressions.  Unchanged from prior. ____________________________________________  RADIOLOGY  I have personally reviewed the images performed during this visit and I agree with the Radiologist's read.   Interpretation by Radiologist:  Dg Chest 2 View  Result Date: 02/28/2019 CLINICAL DATA:  Chest pain EXAM: CHEST - 2 VIEW COMPARISON:  03/20/2017 FINDINGS: The heart size and mediastinal contours are within normal limits. Both lungs are clear. The visualized skeletal structures are  unremarkable. IMPRESSION: Clear lungs. Electronically Signed   By: Deatra RobinsonKevin  Herman M.D.   On: 02/28/2019 01:30     ____________________________________________   PROCEDURES  Procedure(s) performed: None Procedures Critical Care performed:  None ____________________________________________   INITIAL IMPRESSION / ASSESSMENT AND PLAN / ED COURSE  38 y.o. male with a history of OSA not on CPAP who presents for evaluation of COVID-like symptoms since this am. Positive exposure to COVID.  Patient with normal work of breathing, normal sats both at rest and with ambulation, lungs are clear to auscultation.  EKG and troponin with no signs of myocarditis.  Chest x-ray with no infiltrate, no pulmonary edema.  Labs otherwise with no significant abnormalities.  Sugar is borderline.  Patient has no known diagnosis of diabetes.  Recommended follow-up with PCP for fasting blood glucose.  Patient was given a GI cocktail, Zofran and fluids for most likely GERD symptoms that he has been experience.  He feels markedly improved.  Discussed quarantine recommendations for him and all household members.  Discussed the importance of getting a pulse oximeter and  checking his oxygenation 3 times daily.  Discussed supportive care follow-up with PCP.  Discussed standard return precautions.       As part of my medical decision making, I reviewed the following data within the electronic MEDICAL RECORD NUMBER Nursing notes reviewed and incorporated, Labs reviewed , EKG interpreted , Old EKG reviewed, Old chart reviewed, Radiograph reviewed , Notes from prior ED visits and [place] Controlled Substance Database   Patient was evaluated in Emergency Department today for the symptoms described in the history of present illness. Patient was evaluated in the context of the global COVID-19 pandemic, which necessitated consideration that the patient might be at risk for infection with the SARS-CoV-2 virus that causes COVID-19. Institutional  protocols and algorithms that pertain to the evaluation of patients at risk for COVID-19 are in a state of rapid change based on information released by regulatory bodies including the CDC and federal and state organizations. These policies and algorithms were followed during the patient's care in the ED.   ____________________________________________   FINAL CLINICAL IMPRESSION(S) / ED DIAGNOSES   Final diagnoses:  Suspected Covid-19 Virus Infection  Gastroesophageal reflux disease, esophagitis presence not specified      NEW MEDICATIONS STARTED DURING THIS VISIT:  ED Discharge Orders         Ordered    famotidine (PEPCID) 40 MG tablet  Daily at bedtime     02/28/19 0348    ondansetron (ZOFRAN ODT) 4 MG disintegrating tablet  Every 8 hours PRN     02/28/19 0348           Note:  This document was prepared using Dragon voice recognition software and may include unintentional dictation errors.    Don PerkingVeronese, [place], MD 02/28/19 [phone]

## 2019-02-28 NOTE — ED Triage Notes (Addendum)
Pt reports chest tightness to the left side of his chest off and on for a couple of weeks, worse tonight; now also having headache, diarrhea, nonproductive cough; pt says he did a visitation 2 days ago with someone that is Covid positive; pt reports general malaise as well; awake and alert; talking in complete coherent sentences; pt adds he's having diarrhea and belching

## 2020-08-11 IMAGING — CR CHEST - 2 VIEW
2 series · 2 of 2 positions shown · non-contrast
Comparison: 03/20/2017

CLINICAL DATA: Chest pain

EXAM:
CHEST - 2 VIEW

[chest pa]
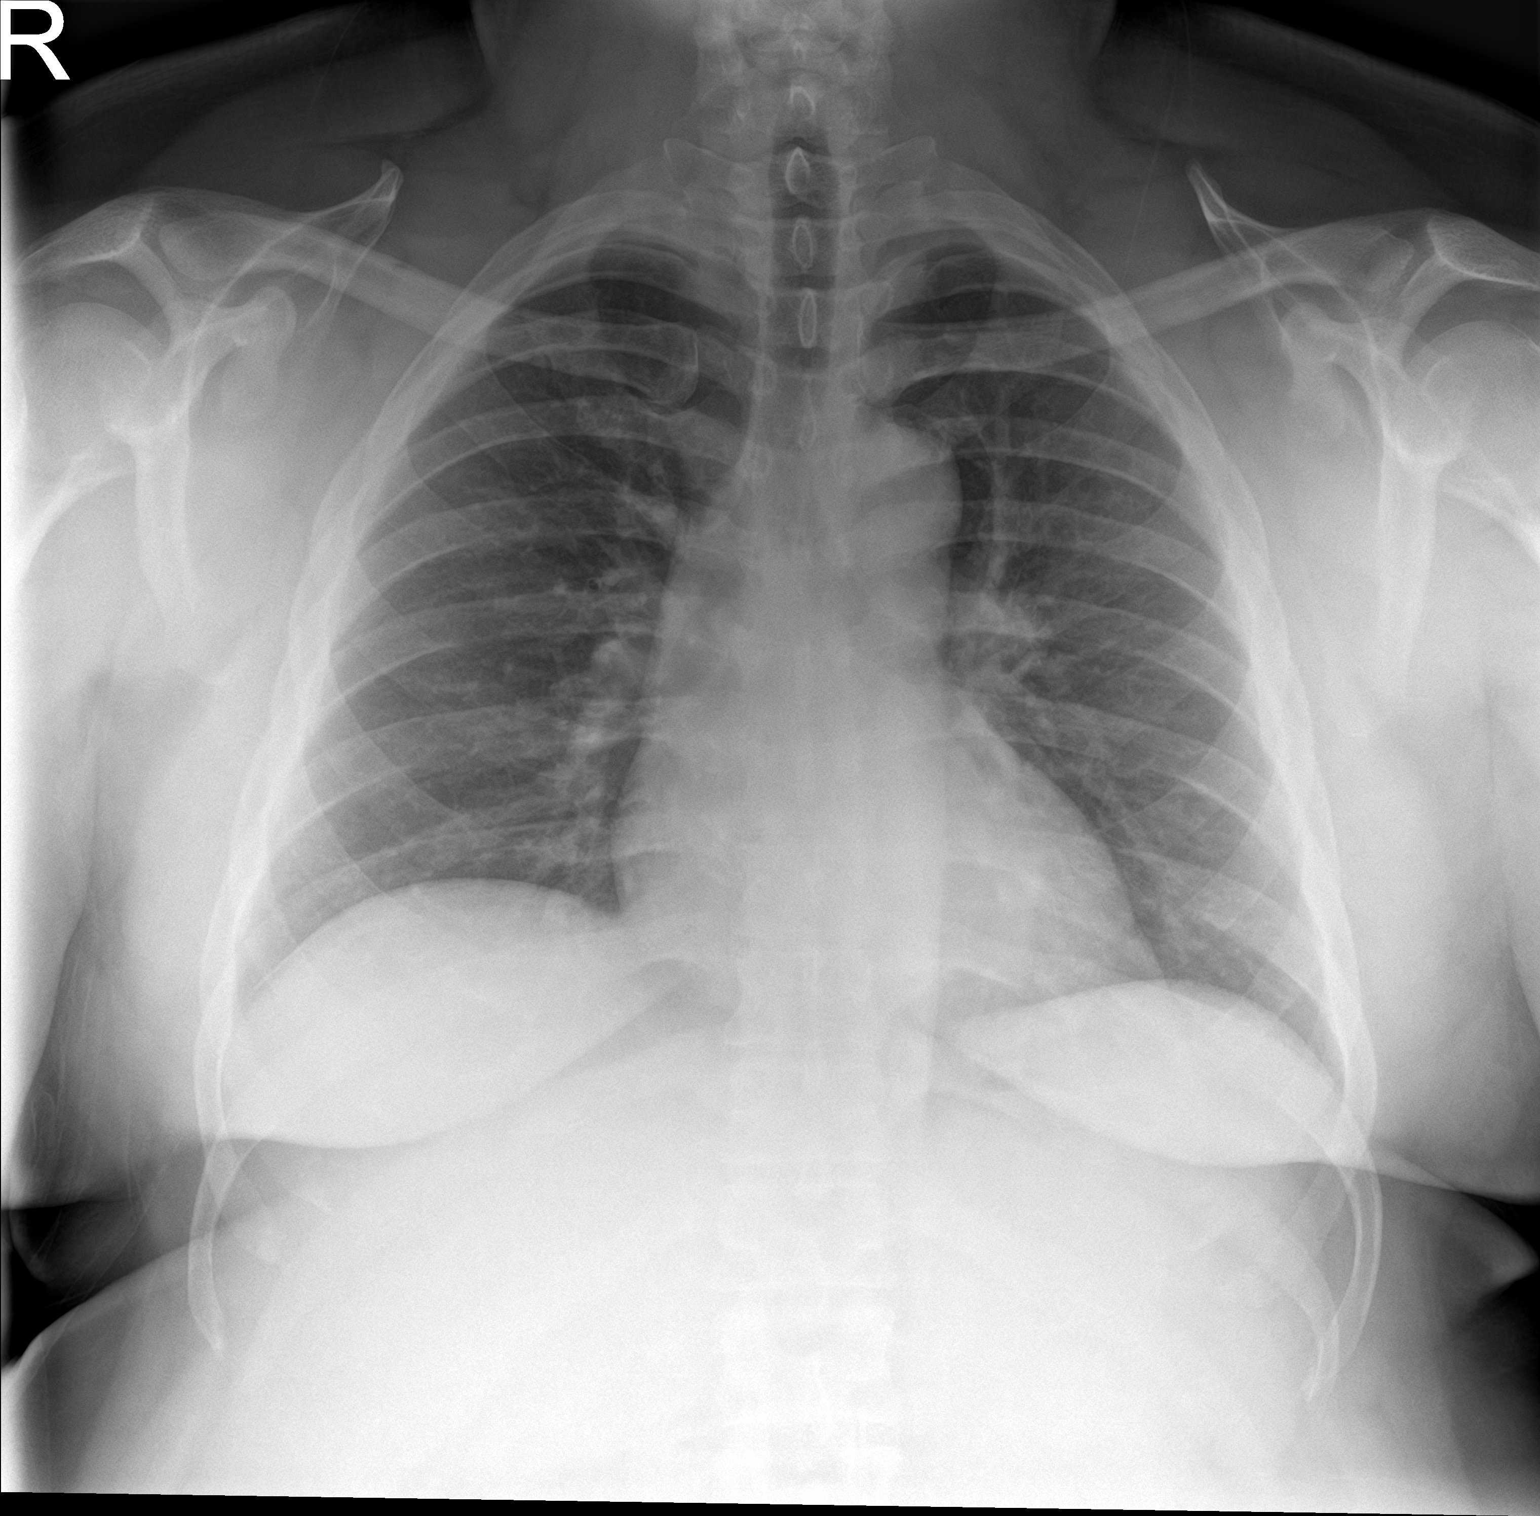

[chest lat]
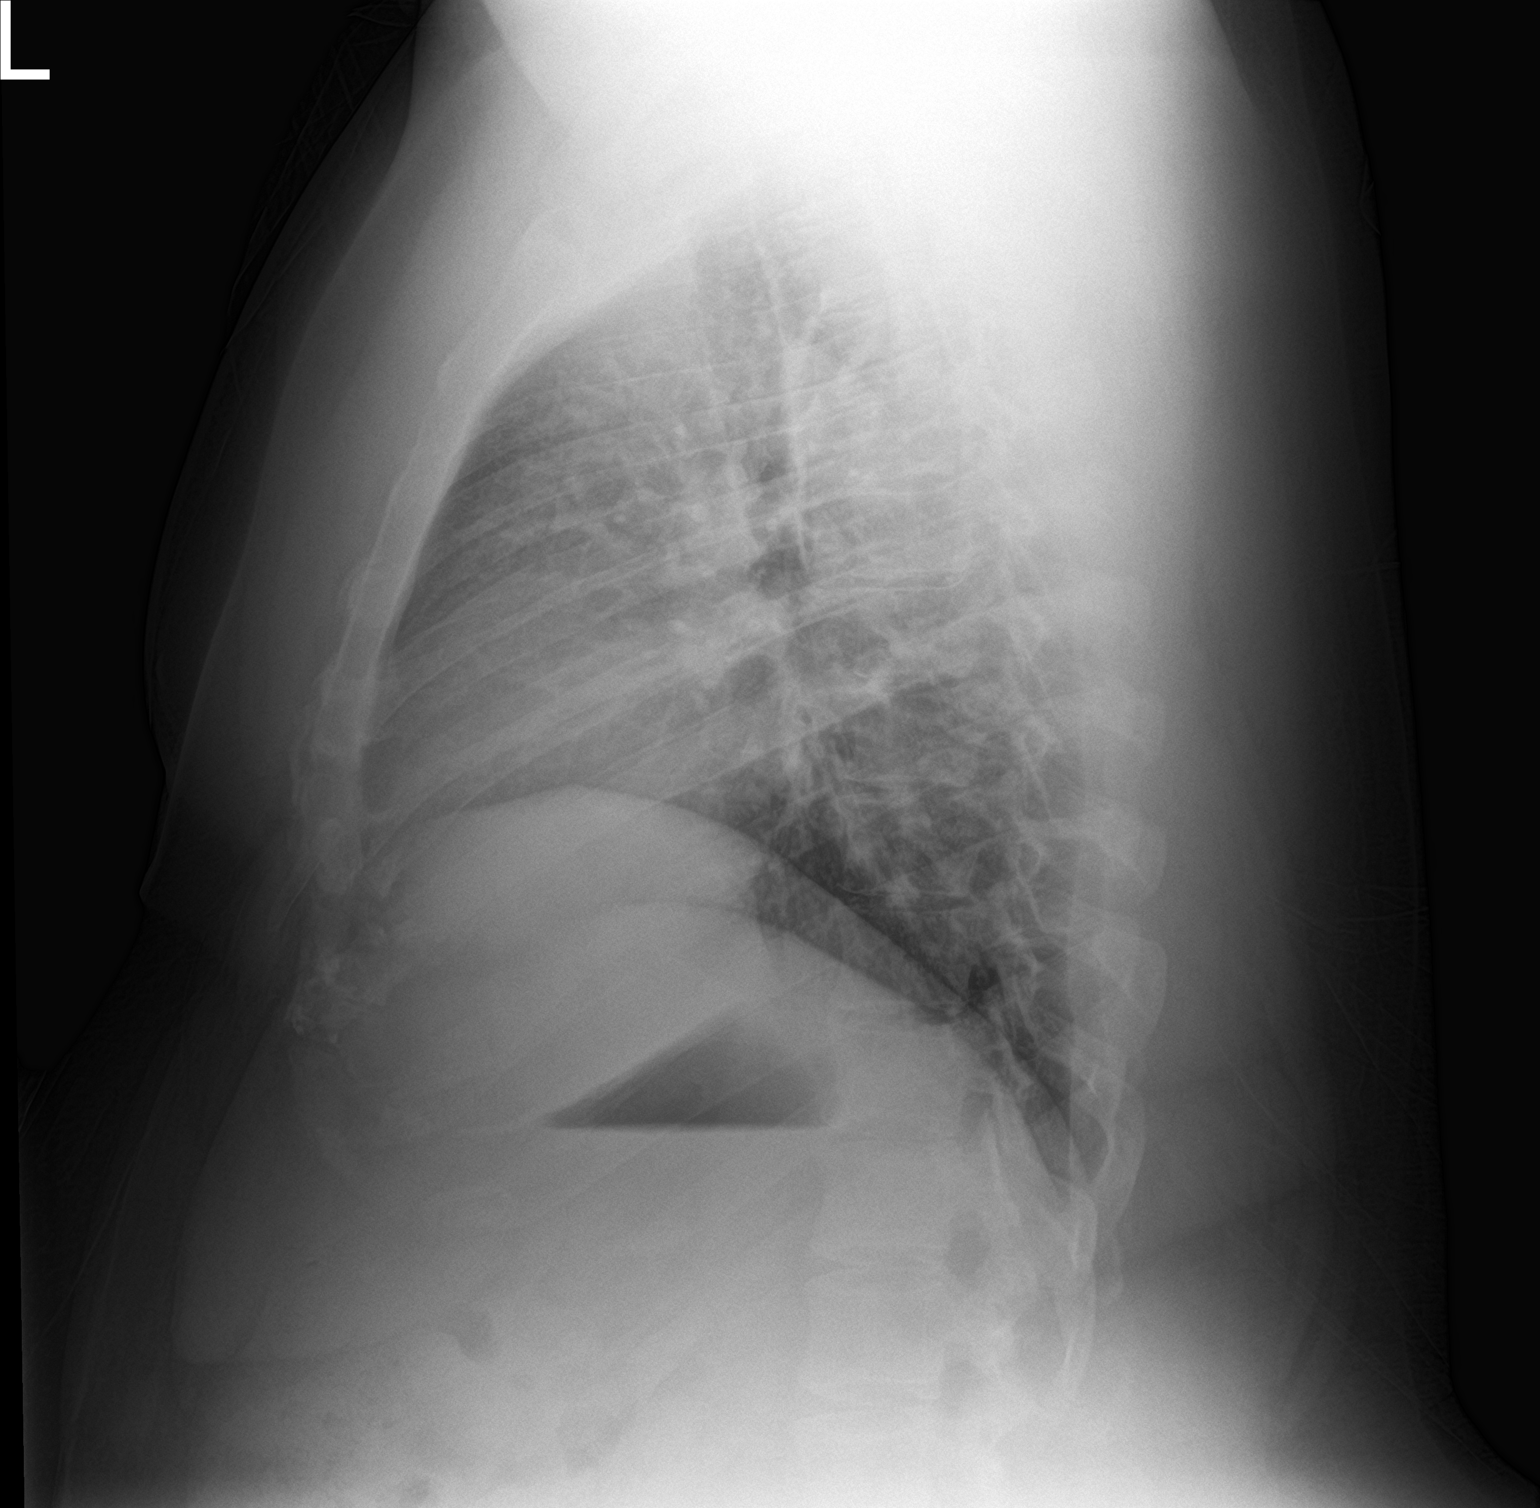

[2 of 2 positions shown; findings below may reference images not displayed]

FINDINGS: The heart size and mediastinal contours are within normal limits.
Both lungs are clear. The visualized skeletal structures are
unremarkable.
IMPRESSION: Clear lungs.

## 2020-12-26 ENCOUNTER — Emergency Department
Admission: EM | Admit: 2020-12-26 | Discharge: 2020-12-26 | Disposition: A | Discharge status: Home / Self Care | Payer: Managed Care, Other (non HMO) | Attending: Student in an Organized Health Care Education/Training Program | Admitting: Student in an Organized Health Care Education/Training Program

## 2020-12-26 ENCOUNTER — Encounter: Payer: Self-pay | Admitting: Emergency Medicine

## 2020-12-26 ENCOUNTER — Other Ambulatory Visit: Payer: Self-pay

## 2020-12-26 DIAGNOSIS — J029 Acute pharyngitis, unspecified: Secondary | ICD-10-CM

## 2020-12-26 DIAGNOSIS — F17290 Nicotine dependence, other tobacco product, uncomplicated: Secondary | ICD-10-CM | POA: Insufficient documentation

## 2020-12-26 DIAGNOSIS — R0602 Shortness of breath: Secondary | ICD-10-CM | POA: Insufficient documentation

## 2020-12-26 DIAGNOSIS — Z20822 Contact with and (suspected) exposure to covid-19: Secondary | ICD-10-CM | POA: Insufficient documentation

## 2020-12-26 LAB — SARS CORONAVIRUS 2 (TAT 6-24 HRS): SARS Coronavirus 2: NEGATIVE

## 2020-12-26 MED ORDER — AMOXICILLIN 500 MG PO CAPS
500.0000 mg | ORAL_CAPSULE | Freq: Once | ORAL | Status: AC
  Administered 2020-12-26: 500 mg via ORAL
  Filled 2020-12-26: qty 1

## 2020-12-26 MED ORDER — AMOXICILLIN 500 MG PO CAPS: 500.0000 mg | ORAL_CAPSULE | Freq: Three times a day (TID) | ORAL | 0 refills | Status: AC

## 2020-12-26 MED ORDER — DEXAMETHASONE 6 MG PO TABS
10.0000 mg | ORAL_TABLET | Freq: Once | ORAL | Status: AC
  Administered 2020-12-26: 10 mg via ORAL
  Filled 2020-12-26: qty 1

## 2020-12-26 NOTE — ED Triage Notes (Signed)
Pt comes into the ED via POV c/o sore throat.  Pt's daughter has been diagnosed with strep throat.  Pt in NAD at this time with clear voice and no difficulty breathing.

## 2020-12-26 NOTE — ED Provider Notes (Signed)
[facility] Emergency Department Provider Note    Event Date/Time   First MD Initiated Contact with Patient 12/26/20 1057     (approximate)  I have reviewed the triage vital signs and the nursing notes.   HISTORY  Chief ComplaintSore Throat    HPI Michael Hampton is a 40 y.o. male presents to the ER for low morning 24 hours of sore throat as well as some neck fullness feeling like he is got strep throat.  His daughter was sick with strep throat just a few days ago was given antibiotics with improvement now he is developed similar symptoms.  No trouble swallowing.  No cough.  No high fevers.  No nausea or vomiting.  No trouble speaking or change in phonation.   Past Medical History:  Diagnosis Date   Sleep apnea    History reviewed. No pertinent family history. Past Surgical History:  Procedure Laterality Date   VASECTOMY     There are no problems to display for this patient.     Prior to Admission medications   Medication Sig Start Date End Date Taking? Authorizing Provider  amoxicillin (AMOXIL) 500 MG capsule Take 1 capsule (500 mg total) by mouth 3 (three) times daily for 7 days. 12/26/20 01/02/21 Yes Willy Eddy, MD  aluminum-magnesium hydroxide-simethicone (MAALOX) 200-200-20 MG/5ML SUSP Take 30 mLs by mouth 4 (four) times daily -  before meals and at bedtime. Patient not taking: Reported on 03/20/2017 05/15/16   Sharman Cheek, MD  famotidine (PEPCID) 40 MG tablet Take 1 tablet (40 mg total) by mouth at bedtime. 02/28/19 03/30/19  Nita Sickle, MD  metoCLOPramide (REGLAN) 10 MG tablet Take 1 tablet (10 mg total) by mouth every 6 (six) hours as needed. Patient not taking: Reported on 03/20/2017 05/15/16   Sharman Cheek, MD  Multiple Vitamins-Minerals (MULTIVITAMIN ADULT PO) Take 1 tablet by mouth daily.    [provider]  ondansetron (ZOFRAN ODT) 4 MG disintegrating tablet Take 1 tablet (4 mg total) by mouth every 8 (eight)  hours as needed. 02/28/19   Don Perking, [place], MD  polyethylene glycol powder (GLYCOLAX/MIRALAX) powder Take 17 g by mouth once. Patient not taking: Reported on 03/20/2017 02/22/15   Minna Antis, MD    Allergies Patient has no known allergies.    Social History Social History   Tobacco Use   Smoking status: Some Days    Pack years: 0.00    Types: Cigars   Smokeless tobacco: Never  Substance Use Topics   Alcohol use: No   Drug use: No    Review of Systems Patient denies headaches, rhinorrhea, blurry vision, numbness, shortness of breath, chest pain, edema, cough, abdominal pain, nausea, vomiting, diarrhea, dysuria, fevers, rashes or hallucinations unless otherwise stated above in HPI. ____________________________________________   PHYSICAL EXAM:  VITAL SIGNS: Vitals:   12/26/20 0942  BP: (!) 148/88  Pulse: 88  Resp: 20  Temp: 98.2 F (36.8 C)  SpO2: 98%    Constitutional: Alert and oriented. Well appearing and in no acute distress. Eyes: Conjunctivae are normal.  Head: Atraumatic. Nose: No congestion/rhinnorhea. Mouth/Throat: Mucous membranes are moist.  Bilateral tonsillar edema erythema with exudates.  Uvula is midline.  Normal phonation. Neck: Painless ROM.  Cardiovascular:   Good peripheral circulation. Respiratory: Normal respiratory effort.  No retractions.  Gastrointestinal: Soft and nontender.  Musculoskeletal: No lower extremity tenderness .  No joint effusions. Neurologic:  Normal speech and language. No gross focal neurologic deficits are appreciated.  Skin:  Skin  is warm, dry and intact. No rash noted. Psychiatric: Mood and affect are normal. Speech and behavior are normal.  ____________________________________________   LABS (all labs ordered are listed, but only abnormal results are displayed)  No results found for this or any previous visit (from the past 24  hour(s)). ____________________________________________ ____________________________________________  RADIOLOGY   ____________________________________________   PROCEDURES  Procedure(s) performed:  Procedures    Critical Care performed: no ____________________________________________   INITIAL IMPRESSION / ASSESSMENT AND PLAN / ED COURSE  Pertinent labs & imaging results that were available during my care of the patient were reviewed by me and considered in my medical decision making (see chart for details).   DDX: [pharyngitis, pta, rpa, Michael Hampton is a 40 y.o. who presents to the ED  cc of sore throat. Patient is AFVSS in ED. Exam as above. Given current presentation have considered the above differential. Not consistent with PTA or RPA as no muffled voice, uvula midline, no asymmetry. + exudates. no neck stiffness.  Daughter strep positive therefore will empirically treat.  The patient will be placed on continuous pulse oximetry and telemetry for monitoring.  Laboratory evaluation will be sent to evaluate for the above complaints.     Presentation most consistent with acute viral pharyngitis at this time. Will provide symptomatic treatment and strict return precautions.  The patient was evaluated in Emergency Department today for the symptoms described in the history of present illness. He/she was evaluated in the context of the global COVID-19 pandemic, which necessitated consideration that the patient might be at risk for infection with the SARS-CoV-2 virus that causes COVID-19. Institutional protocols and algorithms that pertain to the evaluation of patients at risk for COVID-19 are in a state of rapid change based on information released by regulatory bodies including the CDC and federal and state organizations. These policies and algorithms were followed during the patient's care in the ED.      ____________________________________________   FINAL CLINICAL  IMPRESSION(S) / ED DIAGNOSES  Final diagnoses:  Sore throat      NEW MEDICATIONS STARTED DURING THIS VISIT:  New Prescriptions   AMOXICILLIN (AMOXIL) 500 MG CAPSULE    Take 1 capsule (500 mg total) by mouth 3 (three) times daily for 7 days.     Note:  This document was prepared using Dragon voice recognition software and may include unintentional dictation errors.     Willy Eddy, MD 12/26/20 [phone]
